# Patient Record
Sex: Female | Born: 1995 | Race: Asian | Hispanic: No | Marital: Single | Smoking: Never smoker
Health system: Southern US, Community
[De-identification: ages and names within clinical notes are randomized; demographics above are authoritative.]

## PROBLEM LIST (undated history)

## (undated) DIAGNOSIS — K644 Residual hemorrhoidal skin tags: Secondary | ICD-10-CM

## (undated) DIAGNOSIS — Z789 Other specified health status: Secondary | ICD-10-CM

## (undated) DIAGNOSIS — O4290 Premature rupture of membranes, unspecified as to length of time between rupture and onset of labor, unspecified weeks of gestation: Secondary | ICD-10-CM

## (undated) HISTORY — PX: NO PAST SURGERIES: SHX2092

## (undated) HISTORY — DX: Other specified health status: Z78.9

## (undated) HISTORY — DX: Residual hemorrhoidal skin tags: K64.4

---
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
## 2018-02-27 NOTE — L&D Delivery Note (Signed)
Delivery Note Pt pushed very well for 39minutes for delivery.  At 5:29 AM a viable and healthy female was delivered via Vaginal, Spontaneous (Presentation: OA; LOT  ).  APGAR: 9,9 ; weight P .   Placenta status: delivered, intact .  Cord: 3V with the following complications: none.    Anesthesia:  epidural Episiotomy: None Lacerations: Labial Suture Repair: 3.0 vicryl rapide Est. Blood Loss (mL): 14  Mom to postpartum.  Baby to Couplet care / Skin to Skin.  Kari Schmidt 10/07/2018, 5:52 AM  Br/O+/RI/Tdap in PNC/comtra?  D/w pt circumcision for female infant inc r/b/a wish to proceed in office

## 2018-03-27 LAB — OB RESULTS CONSOLE GC/CHLAMYDIA
Chlamydia: NEGATIVE
Gonorrhea: NEGATIVE

## 2018-03-27 LAB — OB RESULTS CONSOLE HIV ANTIBODY (ROUTINE TESTING): HIV: NONREACTIVE

## 2018-03-27 LAB — OB RESULTS CONSOLE ANTIBODY SCREEN: Antibody Screen: NEGATIVE

## 2018-03-27 LAB — OB RESULTS CONSOLE HEPATITIS B SURFACE ANTIGEN: Hepatitis B Surface Ag: NEGATIVE

## 2018-03-27 LAB — OB RESULTS CONSOLE RUBELLA ANTIBODY, IGM: Rubella: IMMUNE

## 2018-03-27 LAB — OB RESULTS CONSOLE ABO/RH: RH Type: POSITIVE

## 2018-03-27 LAB — OB RESULTS CONSOLE RPR: RPR: NONREACTIVE

## 2018-07-30 LAB — OB RESULTS CONSOLE RPR: RPR: NONREACTIVE

## 2018-07-30 LAB — OB RESULTS CONSOLE HIV ANTIBODY (ROUTINE TESTING): HIV: NONREACTIVE

## 2018-09-27 LAB — OB RESULTS CONSOLE GBS: GBS: NEGATIVE

## 2018-10-06 ENCOUNTER — Inpatient Hospital Stay (HOSPITAL_COMMUNITY): Payer: BLUE CROSS/BLUE SHIELD | Admitting: Anesthesiology

## 2018-10-06 ENCOUNTER — Inpatient Hospital Stay (HOSPITAL_COMMUNITY)
Admission: AD | Admit: 2018-10-06 | Discharge: 2018-10-09 | DRG: 807 | Disposition: A | Discharge status: Home / Self Care | Payer: BLUE CROSS/BLUE SHIELD | Attending: Obstetrics and Gynecology | Admitting: Obstetrics and Gynecology

## 2018-10-06 ENCOUNTER — Other Ambulatory Visit: Payer: Self-pay

## 2018-10-06 ENCOUNTER — Encounter (HOSPITAL_COMMUNITY): Payer: Self-pay | Admitting: *Deleted

## 2018-10-06 DIAGNOSIS — Z20828 Contact with and (suspected) exposure to other viral communicable diseases: Secondary | ICD-10-CM | POA: Diagnosis present

## 2018-10-06 DIAGNOSIS — Z3A38 38 weeks gestation of pregnancy: Secondary | ICD-10-CM

## 2018-10-06 DIAGNOSIS — O4292 Full-term premature rupture of membranes, unspecified as to length of time between rupture and onset of labor: Principal | ICD-10-CM | POA: Diagnosis present

## 2018-10-06 DIAGNOSIS — O4290 Premature rupture of membranes, unspecified as to length of time between rupture and onset of labor, unspecified weeks of gestation: Secondary | ICD-10-CM

## 2018-10-06 HISTORY — DX: Premature rupture of membranes, unspecified as to length of time between rupture and onset of labor, unspecified weeks of gestation: O42.90

## 2018-10-06 LAB — CBC
HCT: 39.9 % (ref 36.0–46.0)
Hemoglobin: 13.2 g/dL (ref 12.0–15.0)
MCH: 28.6 pg (ref 26.0–34.0)
MCHC: 33.1 g/dL (ref 30.0–36.0)
MCV: 86.4 fL (ref 80.0–100.0)
Platelets: 217 10*3/uL (ref 150–400)
RBC: 4.62 MIL/uL (ref 3.87–5.11)
RDW: 13.9 % (ref 11.5–15.5)
WBC: 9.8 10*3/uL (ref 4.0–10.5)
nRBC: 0 % (ref 0.0–0.2)

## 2018-10-06 LAB — SARS CORONAVIRUS 2 BY RT PCR (HOSPITAL ORDER, PERFORMED IN ~~LOC~~ HOSPITAL LAB): SARS Coronavirus 2: NEGATIVE

## 2018-10-06 LAB — TYPE AND SCREEN
ABO/RH(D): O POS
Antibody Screen: NEGATIVE

## 2018-10-06 MED ORDER — DIPHENHYDRAMINE HCL 50 MG/ML IJ SOLN: 12.5000 mg | INTRAMUSCULAR | Status: DC | PRN

## 2018-10-06 MED ORDER — LACTATED RINGERS IV SOLN
500.0000 mL | Freq: Once | INTRAVENOUS | Status: AC
  Administered 2018-10-06: 23:00:00 500 mL via INTRAVENOUS

## 2018-10-06 MED ORDER — PHENYLEPHRINE 40 MCG/ML (10ML) SYRINGE FOR IV PUSH (FOR BLOOD PRESSURE SUPPORT): 80.0000 ug | PREFILLED_SYRINGE | INTRAVENOUS | Status: DC | PRN

## 2018-10-06 MED ORDER — OXYCODONE-ACETAMINOPHEN 5-325 MG PO TABS: 2.0000 | ORAL_TABLET | ORAL | Status: DC | PRN

## 2018-10-06 MED ORDER — LACTATED RINGERS IV SOLN
500.0000 mL | INTRAVENOUS | Status: DC | PRN
  Administered 2018-10-07: 500 mL via INTRAVENOUS

## 2018-10-06 MED ORDER — OXYTOCIN 40 UNITS IN NORMAL SALINE INFUSION - SIMPLE MED
1.0000 m[IU]/min | INTRAVENOUS | Status: DC
  Filled 2018-10-06: qty 1000

## 2018-10-06 MED ORDER — ACETAMINOPHEN 325 MG PO TABS
650.0000 mg | ORAL_TABLET | ORAL | Status: DC | PRN
  Administered 2018-10-07: 650 mg via ORAL
  Filled 2018-10-06: qty 2

## 2018-10-06 MED ORDER — ONDANSETRON HCL 4 MG/2ML IJ SOLN: 4.0000 mg | Freq: Four times a day (QID) | INTRAMUSCULAR | Status: DC | PRN

## 2018-10-06 MED ORDER — OXYTOCIN BOLUS FROM INFUSION
500.0000 mL | Freq: Once | INTRAVENOUS | Status: AC
  Administered 2018-10-07: 06:00:00 500 mL via INTRAVENOUS

## 2018-10-06 MED ORDER — TERBUTALINE SULFATE 1 MG/ML IJ SOLN: 0.2500 mg | Freq: Once | INTRAMUSCULAR | Status: DC | PRN

## 2018-10-06 MED ORDER — LIDOCAINE HCL (PF) 1 % IJ SOLN: 30.0000 mL | INTRAMUSCULAR | Status: DC | PRN

## 2018-10-06 MED ORDER — SODIUM CHLORIDE (PF) 0.9 % IJ SOLN
INTRAMUSCULAR | Status: DC | PRN
  Administered 2018-10-06: 12 mL/h via EPIDURAL

## 2018-10-06 MED ORDER — FENTANYL-BUPIVACAINE-NACL 0.5-0.125-0.9 MG/250ML-% EP SOLN
12.0000 mL/h | EPIDURAL | Status: DC | PRN
  Filled 2018-10-06: qty 250

## 2018-10-06 MED ORDER — OXYCODONE-ACETAMINOPHEN 5-325 MG PO TABS: 1.0000 | ORAL_TABLET | ORAL | Status: DC | PRN

## 2018-10-06 MED ORDER — OXYTOCIN 40 UNITS IN NORMAL SALINE INFUSION - SIMPLE MED
2.5000 [IU]/h | INTRAVENOUS | Status: DC
  Administered 2018-10-07: 2.5 [IU]/h via INTRAVENOUS

## 2018-10-06 MED ORDER — LACTATED RINGERS IV SOLN
INTRAVENOUS | Status: DC
  Administered 2018-10-06 (×2): via INTRAVENOUS

## 2018-10-06 MED ORDER — EPHEDRINE 5 MG/ML INJ: 10.0000 mg | INTRAVENOUS | Status: DC | PRN

## 2018-10-06 MED ORDER — SOD CITRATE-CITRIC ACID 500-334 MG/5ML PO SOLN: 30.0000 mL | ORAL | Status: DC | PRN

## 2018-10-06 MED ORDER — OXYTOCIN 40 UNITS IN NORMAL SALINE INFUSION - SIMPLE MED: 1.0000 m[IU]/min | INTRAVENOUS | Status: DC

## 2018-10-06 MED ORDER — FENTANYL-BUPIVACAINE-NACL 0.5-0.125-0.9 MG/250ML-% EP SOLN: 12.0000 mL/h | EPIDURAL | Status: DC | PRN

## 2018-10-06 MED ORDER — LIDOCAINE HCL (PF) 1 % IJ SOLN
INTRAMUSCULAR | Status: DC | PRN
  Administered 2018-10-06 (×2): 5 mL via EPIDURAL

## 2018-10-06 MED ORDER — BUTORPHANOL TARTRATE 1 MG/ML IJ SOLN
1.0000 mg | INTRAMUSCULAR | Status: DC | PRN
  Filled 2018-10-06: qty 1

## 2018-10-06 NOTE — MAU Note (Signed)
Kari Schmidt is a 23 y.o. at [redacted]w[redacted]d here in MAU reporting:  +LOF Clear +contractions Onset of complaint: 3pm today Pain score: 6/10 Desires natural birth at this time Vitals:   10/06/18 1733  BP: 119/87  Pulse: 76  Resp: 16  Temp: 98.2 F (36.8 C)  SpO2: 99%     FHT: 161 Lab orders placed from triage: mau labor triage

## 2018-10-06 NOTE — Anesthesia Procedure Notes (Signed)
Epidural Patient location during procedure: OB  Staffing Anesthesiologist: Kerline Trahan, MD Performed: anesthesiologist   Preanesthetic Checklist Completed: patient identified, site marked, surgical consent, pre-op evaluation, timeout performed, IV checked, risks and benefits discussed and monitors and equipment checked  Epidural Patient position: sitting Prep: DuraPrep Patient monitoring: heart rate, continuous pulse ox and blood pressure Approach: right paramedian Location: L3-L4 Injection technique: LOR saline  Needle:  Needle type: Tuohy  Needle gauge: 17 G Needle length: 9 cm and 9 Needle insertion depth: 5 cm Catheter type: closed end flexible Catheter size: 20 Guage Catheter at skin depth: 9 cm Test dose: negative  Assessment Events: blood not aspirated, injection not painful, no injection resistance, negative IV test and no paresthesia  Additional Notes Patient identified. Risks/Benefits/Options discussed with patient including but not limited to bleeding, infection, nerve damage, paralysis, failed block, incomplete pain control, headache, blood pressure changes, nausea, vomiting, reactions to medication both or allergic, itching and postpartum back pain. Confirmed with bedside nurse the patient's most recent platelet count. Confirmed with patient that they are not currently taking any anticoagulation, have any bleeding history or any family history of bleeding disorders. Patient expressed understanding and wished to proceed. All questions were answered. Sterile technique was used throughout the entire procedure. Please see nursing notes for vital signs. Test dose was given through epidural needle and negative prior to continuing to dose epidural or start infusion. Warning signs of high block given to the patient including shortness of breath, tingling/numbness in hands, complete motor block, or any concerning symptoms with instructions to call for help. Patient was given  instructions on fall risk and not to get out of bed. All questions and concerns addressed with instructions to call with any issues.     

## 2018-10-06 NOTE — Progress Notes (Signed)
Patient ID: Kari Schmidt, female   DOB: 1995-10-11, 23 y.o.   MRN: [id number]  38+ with SROM, sugmentation of labor  Uncomfortable with contractions, desires epidural now  AFVSS  gen NAD  FHTs 150-155, mod var, + accels, category 1,  toco q 26min  7/90/0-+1  Continue current mgmt, expect SVD

## 2018-10-06 NOTE — MAU Note (Signed)
Patient denies any symptoms. covid admission test collected.

## 2018-10-06 NOTE — H&P (Addendum)
Kari Schmidt is a 23 y.o. female G1P0 at 15+ with SROM, confirmed w amniosure in MAU.  Relatively uncomplicated PNC.  Dated by LMP.  Received Tdap 07/30/18    OB History    Gravida  1   Para      Term      Preterm      AB      Living        SAB      TAB      Ectopic      Multiple      Live Births            G1 present  No STD No abn pap  Past Medical History:  Diagnosis Date  . PROM (premature rupture of membranes) 10/06/2018   History reviewed. No pertinent surgical history. Family History: none Social History:  has no history on file for tobacco, alcohol, and drug.   Meds PNV,  All NKDA     Maternal Diabetes: No Genetic Screening: Normal Maternal Ultrasounds/Referrals: Normal Fetal Ultrasounds or other Referrals:  None Maternal Substance Abuse:  No Significant Maternal Medications:  None Significant Maternal Lab Results:  Group B Strep negative Other Comments:  None  Review of Systems  Constitutional: Negative.   HENT: Negative.   Eyes: Negative.   Respiratory: Negative.   Cardiovascular: Negative.   Gastrointestinal: Negative.   Genitourinary: Negative.   Musculoskeletal: Negative.   Skin: Negative.   Neurological: Negative.   Psychiatric/Behavioral: Negative.    Maternal Medical History:  Reason for admission: Rupture of membranes.   Contractions: Frequency: irregular.    Fetal activity: Perceived fetal activity is normal.    Prenatal Complications - Diabetes: none.    Dilation: 4 Effacement (%): 80 Station: -1 Exam by:: christina robinson rn Blood pressure 119/87, pulse 76, temperature 98.2 F (36.8 C), temperature source Oral, resp. rate 16, weight 59 kg, SpO2 99 %. Maternal Exam:  Abdomen: Patient reports no abdominal tenderness. Fundal height is appropriate for gestation.   Estimated fetal weight is 6-7#.   Fetal presentation: vertex  Introitus: Normal vulva. Normal vagina.    Physical Exam  Constitutional: She is oriented  to person, place, and time. She appears well-developed and well-nourished.  HENT:  Head: Normocephalic and atraumatic.  Cardiovascular: Normal rate and regular rhythm.  Respiratory: Effort normal and breath sounds normal. No respiratory distress. She has no wheezes.  GI: Soft. Bowel sounds are normal. She exhibits no distension. There is no abdominal tenderness.  Genitourinary:    Vulva normal.   Musculoskeletal: Normal range of motion.  Neurological: She is alert and oriented to person, place, and time.  Skin: Skin is warm and dry.  Psychiatric: She has a normal mood and affect. Her behavior is normal.    Prenatal labs: ABO, Rh:  O+ Antibody:  neg Rubella:  immune RPR:   NR HBsAg:   neg HIV:   neg GBS:   neg  Hgb 13.7/Plt 256/Ur Cx neg/GC neg/Chl neg/Varicella NI/Hgb electro WNL/1st tri screen WNL/glucola 98/essential panel CF neg, essential panel neg [place] nl anat, ant plac  Assessment/Plan: 23yo G1P0 at 38+ w SROM for IOL Expect SVD GBBS neg Pitocin prn Epidural and IV pain meds prn   Cherlyn Syring Bovard-Stuckert 10/06/2018, 6:19 PM

## 2018-10-06 NOTE — Anesthesia Preprocedure Evaluation (Signed)

## 2018-10-07 ENCOUNTER — Encounter (HOSPITAL_COMMUNITY): Payer: Self-pay | Admitting: Obstetrics and Gynecology

## 2018-10-07 LAB — ABO/RH: ABO/RH(D): O POS

## 2018-10-07 LAB — RPR: RPR Ser Ql: NONREACTIVE

## 2018-10-07 MED ORDER — PRENATAL MULTIVITAMIN CH
1.0000 | ORAL_TABLET | Freq: Every day | ORAL | Status: DC
  Administered 2018-10-07 – 2018-10-08 (×2): 1 via ORAL
  Filled 2018-10-07 (×3): qty 1

## 2018-10-07 MED ORDER — SENNOSIDES-DOCUSATE SODIUM 8.6-50 MG PO TABS
2.0000 | ORAL_TABLET | ORAL | Status: DC
  Administered 2018-10-07 – 2018-10-08 (×2): 2 via ORAL
  Filled 2018-10-07 (×2): qty 2

## 2018-10-07 MED ORDER — ACETAMINOPHEN 325 MG PO TABS: 650.0000 mg | ORAL_TABLET | ORAL | Status: DC | PRN

## 2018-10-07 MED ORDER — COCONUT OIL OIL
1.0000 "application " | TOPICAL_OIL | Status: DC | PRN
  Administered 2018-10-09: 1 via TOPICAL

## 2018-10-07 MED ORDER — ONDANSETRON HCL 4 MG PO TABS: 4.0000 mg | ORAL_TABLET | ORAL | Status: DC | PRN

## 2018-10-07 MED ORDER — ONDANSETRON HCL 4 MG/2ML IJ SOLN: 4.0000 mg | INTRAMUSCULAR | Status: DC | PRN

## 2018-10-07 MED ORDER — IBUPROFEN 600 MG PO TABS
600.0000 mg | ORAL_TABLET | Freq: Four times a day (QID) | ORAL | Status: DC
  Administered 2018-10-07 – 2018-10-09 (×7): 600 mg via ORAL
  Filled 2018-10-07 (×8): qty 1

## 2018-10-07 MED ORDER — DIBUCAINE (PERIANAL) 1 % EX OINT: 1.0000 "application " | TOPICAL_OINTMENT | CUTANEOUS | Status: DC | PRN

## 2018-10-07 MED ORDER — SIMETHICONE 80 MG PO CHEW: 80.0000 mg | CHEWABLE_TABLET | ORAL | Status: DC | PRN

## 2018-10-07 MED ORDER — WITCH HAZEL-GLYCERIN EX PADS: 1.0000 "application " | MEDICATED_PAD | CUTANEOUS | Status: DC | PRN

## 2018-10-07 MED ORDER — BENZOCAINE-MENTHOL 20-0.5 % EX AERO
1.0000 "application " | INHALATION_SPRAY | CUTANEOUS | Status: DC | PRN
  Administered 2018-10-07: 1 via TOPICAL
  Filled 2018-10-07: qty 56

## 2018-10-07 MED ORDER — DIPHENHYDRAMINE HCL 25 MG PO CAPS: 25.0000 mg | ORAL_CAPSULE | Freq: Four times a day (QID) | ORAL | Status: DC | PRN

## 2018-10-07 MED ORDER — TETANUS-DIPHTH-ACELL PERTUSSIS 5-2.5-18.5 LF-MCG/0.5 IM SUSP: 0.5000 mL | Freq: Once | INTRAMUSCULAR | Status: DC

## 2018-10-07 MED ORDER — LACTATED RINGERS IV SOLN: INTRAVENOUS | Status: DC

## 2018-10-07 MED ORDER — ZOLPIDEM TARTRATE 5 MG PO TABS: 5.0000 mg | ORAL_TABLET | Freq: Every evening | ORAL | Status: DC | PRN

## 2018-10-07 NOTE — Anesthesia Postprocedure Evaluation (Signed)
Anesthesia Post Note  Patient: Jamesia Linnen  Procedure(s) Performed: AN AD Wilson     Patient location during evaluation: Mother Baby Anesthesia Type: Epidural Level of consciousness: awake and alert Pain management: pain level controlled Vital Signs Assessment: post-procedure vital signs reviewed and stable Respiratory status: spontaneous breathing, nonlabored ventilation and respiratory function stable Cardiovascular status: stable Postop Assessment: no headache, no backache and epidural receding Anesthetic complications: no    Last Vitals:  Vitals:   10/07/18 0850 10/07/18 1250  BP: 107/81 112/64  Pulse: 95 78  Resp: 18 18  Temp: 37 C 36.4 C  SpO2:      Last Pain:  Vitals:   10/07/18 1250  TempSrc: Oral  PainSc: 0-No pain   Pain Goal: Patients Stated Pain Goal: 0 (10/06/18 1730)                 Toriann Spadoni

## 2018-10-07 NOTE — Progress Notes (Signed)
Patient ID: Kari Schmidt, female   DOB: 1995-05-02, 23 y.o.   MRN: [id number] PPD#0 Pt doing well hours after delivery. She is sitting up, breastfeeding, slight fatigue. She denies pain, HA, blurry vision, SOB or CP. Lochia mild/moderate.  VSS GEN - NAD ABD - FF EXT - no homans  A/P: PPD#0 s/p svd first baby - stable         Routine pp care

## 2018-10-08 LAB — CBC
HCT: 33.3 % — ABNORMAL LOW (ref 36.0–46.0)
Hemoglobin: 10.9 g/dL — ABNORMAL LOW (ref 12.0–15.0)
MCH: 28.3 pg (ref 26.0–34.0)
MCHC: 32.7 g/dL (ref 30.0–36.0)
MCV: 86.5 fL (ref 80.0–100.0)
Platelets: 180 10*3/uL (ref 150–400)
RBC: 3.85 MIL/uL — ABNORMAL LOW (ref 3.87–5.11)
RDW: 13.9 % (ref 11.5–15.5)
WBC: 14.5 10*3/uL — ABNORMAL HIGH (ref 4.0–10.5)
nRBC: 0 % (ref 0.0–0.2)

## 2018-10-08 LAB — BIRTH TISSUE RECOVERY COLLECTION (PLACENTA DONATION)

## 2018-10-08 NOTE — Progress Notes (Signed)
CSW received consult due to score 11 on Edinburgh Depression Screen.    When CSW arrived, [name] was resting on the couch and infant was asleep in the bassinet.  Throughout the assessment [name] was attentive to infant and responded appropriately to infant's cues. CSW explained CSW's role and invited [name] to ask questions. [name] was polite, easy to engage, and was receptive to meeting with CSW.   [name] shared that [name] and FOB are not in a relationship but has agreed to be great co-parents.  [name] also reported that she has a great support team that consists of [name]'s and FOB's immediate and extended family.  Per [name], FOB is currently deployed however has been supportive from a distance during [name]'s pregnancy.   CSW reviewed [name]'s Edinburgh responses and [name] stated, "My responses were based on my delivery. I was nervous, scared, and worried." CSW validated and normalized [name]'s thoughts and feelings.  [name] denied having any of those thoughts currently and shared that feels excited about being a new mom and happy.    CSW provided education regarding Baby Blues vs PMADs and provided [name] with resources for mental health follow up.  CSW encouraged [name] to evaluate her mental health throughout the postpartum period with the use of the New Mom Checklist developed by Postpartum Progress as well as the Edinburgh Postnatal Depression Scale and notify a medical professional if symptoms arise. CSW did not present with any acute symptoms and denied SI and HI.  Per [name], [name] has all essential items to care for infant. CSW offered [name] resources for outpatient counseling and [name] declined.  There are no barriers to discharge.    Jazelle Achey Boyd-Gilyard, MSW, LCSW Clinical Social Work [phone] 

## 2018-10-08 NOTE — Lactation Note (Addendum)
This note was copied from a baby's chart. Lactation Consultation Note  Patient Name: Kari Schmidt DHRCB'U Date: 10/08/2018 Reason for consult: 1st time breastfeeding;Initial assessment;Early term 37-38.6wks P1, 25 hour female infant, ETI Mom unsure of how many times she has breastfeed, LC encouraged her to document feedings and how long infant is latched to breast. Infant had 3 wet  Per mom, she receives [facility] in [place]. Mom latched infant on left breast using cross cradle hold, infant was off and on at first but eventually sustained latch and breastfeed for 21 minutes. Infant was given 2 ml of colostrum that mom hand expressed and after infant latched at breast infant given 3 ml of Gerber Gentle formula due infant refusing take any more supplement at this time.  Mom was given hand pump do to not having a hand pump at home.  LC discussed importance of mom expressing a small amount of colostrum prior to latching infant, wait until infant's mouth is wide and tongue down with nose and tongue touching breast. Infant has tight frenulum and cannot extend tongue past gum line, LC mention to mom to as Pediatrician to examine infant's oral cavity in mouth.  Mom has pseudo inverted nipples that are very compressible mom taught back hand expression and infant was given 2 ml of colostrum by spoon.  Mm knows to breastfeed according hunger cues, 8 to 12 times within 24 hours and on demand sheet given. Mom will do as much STS as possible. Reviewed Baby & Me book's Breastfeeding Basics.  Mom made aware of O/P services, breastfeeding support groups, community resources, and our phone # for post-discharge questions.    Maternal Data Formula Feeding for Exclusion: Yes Reason for exclusion: Mother's choice to formula and breast feed on admission Has patient been taught Hand Expression?: Yes(Infant given 2 ml of colostrum by spoon.) Does the patient have breastfeeding experience prior to this delivery?:  No  Feeding Feeding Type: Breast Fed  LATCH Score Latch: Grasps breast easily, tongue down, lips flanged, rhythmical sucking.  Audible Swallowing: A few with stimulation  Type of Nipple: Inverted  Comfort (Breast/Nipple): Soft / non-tender  Hold (Positioning): Assistance needed to correctly position infant at breast and maintain latch.  LATCH Score: 6  Interventions Interventions: Breast feeding basics reviewed;Breast compression;Assisted with latch;Adjust position;Skin to skin;Support pillows;Hand pump;Breast massage;Position options;Hand express;Expressed milk  Lactation Tools Discussed/Used WIC Program: Yes Pump Review: Setup, frequency, and cleaning;Milk Storage Initiated by:: Vicente Serene, IBCLC Date initiated:: 10/08/18   Consult Status Consult Status: Follow-up Date: 10/08/18 Follow-up type: In-patient    Vicente Serene 10/08/2018, 6:36 AM

## 2018-10-08 NOTE — Progress Notes (Signed)
Post Partum Day 1 Subjective: no complaints, up ad lib and tolerating PO  Objective: Blood pressure 108/76, pulse 90, temperature 98.1 F (36.7 C), temperature source Oral, resp. rate 18, height 5\' 2"  (1.575 m), weight 59 kg, SpO2 100 %, unknown if currently breastfeeding.  Physical Exam:  General: alert and cooperative Lochia: appropriate Uterine Fundus: firm   Recent Labs    10/06/18 1756 10/08/18 0527  HGB 13.2 10.9*  HCT 39.9 33.3*    Assessment/Plan: Plan for discharge tomorrow  Baby needs to stay 48 hours per RN   LOS: 2 days   Logan Bores 10/08/2018, 10:28 AM

## 2018-10-09 MED ORDER — IBUPROFEN 600 MG PO TABS: 600.0000 mg | ORAL_TABLET | Freq: Four times a day (QID) | ORAL | 0 refills | Status: DC

## 2018-10-09 NOTE — Lactation Note (Addendum)
This note was copied from a baby's chart. Lactation Consultation Note  Patient Name: Kari Schmidt OEHOZ'Y Date: 10/09/2018  P1, 45 hour female infant, weight loss -4%. Per mom, infant had 3 voids and 5 stools yesterday. Mom's feeding choice is breast and formula feeding.  Mom feels "Kari Schmidt" is breastfeeding well.  Per mom, infant breastfeed for 10 minutes at 4 am.  LC did not observe latch at this time infant is asleep in mom's arms.  She mostly breastfeed yesterday and she did offer him formula 3 times after latching him at breast. Mom has no breastfeeding concerns at this time. Mom knows to ask Nurse or [place] if she has any questions, concerns or need assistance with latching infant to breast.  Mom has list out outreach breastfeeding resources and breastfeeding support groups in the Community. LC discussed engorgement treatment and prevention.   Maternal Data    Feeding    LATCH Score                   Interventions    Lactation Tools Discussed/Used     Consult Status      Vicente Serene 10/09/2018, 5:15 AM

## 2018-10-09 NOTE — Discharge Summary (Signed)
    OB Discharge Summary     Patient Name: Kari Schmidt DOB: 1996/01/08 MRN: [id number]  Date of admission: 10/06/2018 Delivering MD: Janyth Contes   Date of discharge: 10/09/2018  Admitting diagnosis: CTX 4 min Intrauterine pregnancy: [redacted]w[redacted]d     Secondary diagnosis:  Principal Problem:   SVD (spontaneous vaginal delivery) Active Problems:   PROM (premature rupture of membranes)      Discharge diagnosis: Term Pregnancy [facility] course:  Onset of Labor With Vaginal Delivery     23 y.o. yo G1P1001 at [redacted]w[redacted]d was admitted in Latent Labor with SROM on 10/06/2018. Patient had an uncomplicated labor course as follows:  Membrane Rupture Time/Date: 3:00 PM ,10/06/2018   Intrapartum Procedures: Episiotomy: None [1]                                         Lacerations:  Labial [10]  Patient had a delivery of a Viable infant. 10/07/2018  Information for the patient's newborn:  Antwanette, Wesche [[id number]]  Delivery Method: Vaginal, Spontaneous(Filed from Delivery Summary)     Pateint had an uncomplicated postpartum course.  She is ambulating, tolerating a regular diet, passing flatus, and urinating well. Patient is discharged home in stable condition on 10/09/18.   Physical exam  Vitals:   10/08/18 0650 10/08/18 1500 10/08/18 2100 10/09/18 0516  BP: 108/76 99/64 109/79 107/70  Pulse: 90 80 81 80  Resp: 18 16 18 16   Temp: 98.1 F (36.7 C) 98.2 F (36.8 C) 98.4 F (36.9 C) 98.1 F (36.7 C)  TempSrc: Oral Oral Oral Oral  SpO2:  98% 99% 99%  Weight:      Height:       General: alert Lochia: appropriate Uterine Fundus: firm  Labs: Lab Results  Component Value Date   WBC 14.5 (H) 10/08/2018   HGB 10.9 (L) 10/08/2018   HCT 33.3 (L) 10/08/2018   MCV 86.5 10/08/2018   PLT 180 10/08/2018   No flowsheet data found.  Discharge instruction: per After Visit Summary and "Baby and Me Booklet".  After visit meds:  Allergies as of 10/09/2018   No  Known Allergies     Medication List    TAKE these medications   ibuprofen 600 MG tablet Commonly known as: ADVIL Take 1 tablet (600 mg total) by mouth every 6 (six) hours.       Diet: routine diet  Activity: Advance as tolerated. Pelvic rest for 6 weeks.   Outpatient follow up:6 weeks  Newborn Data: Live born female  Birth Weight: 7 lb 0.5 oz (3189 g) APGAR: 9, 9  Newborn Delivery   Birth date/time: 10/07/2018 05:29:00 Delivery type: Vaginal, Spontaneous      Baby Feeding: Bottle Disposition:home with mother   10/09/2018 Clarene Duke, MD

## 2018-10-09 NOTE — Progress Notes (Signed)
PPD #2 Doing well Afeb, VSS Fundus firm D/c home 

## 2018-10-09 NOTE — Discharge Instructions (Signed)
As per discharge pamphlet °

## 2018-10-19 ENCOUNTER — Inpatient Hospital Stay (HOSPITAL_COMMUNITY)
Admission: AD | Admit: 2018-10-19 | Payer: BLUE CROSS/BLUE SHIELD | Source: Home / Self Care | Admitting: Obstetrics and Gynecology

## 2020-02-28 NOTE — L&D Delivery Note (Signed)
Delivery Note Kari Schmidt is a 25 y.o. G2P1001 at [redacted]w[redacted]d admitted for IOL for IUGR.   GBS Status: Negative/-- (05/16 1615) Maximum Maternal Temperature: 98.3  Labor course: Initial SVE: 3/50/-2. Augmentation with: Pitocin. She then progressed to complete.  ROM: 0h 56m with clear fluid  Birth: At 1642 a viable female was delivered via spontaneous vaginal delivery (Presentation: cephalic; LOA). Nuchal cord present x1, baby somersaulted and delivered through. Shoulders and body delivered in usual fashion. Infant placed directly on mom's abdomen for bonding/skin-to-skin, baby dried and stimulated. Cord clamped x 2 after 1 minute and cut by patient. Cord blood collected. The placenta separated spontaneously and delivered via gentle cord traction. Pitocin infused rapidly IV per protocol.  Fundus firm with massage.  Placenta inspected and appears to be intact with a 3 VC.  Placenta/Cord with the following complications: none.  Cord pH: n/a Sponge and instrument count were correct x2.  Intrapartum complications:  None Anesthesia:  epidural Episiotomy: none Lacerations: perineal abrasion, hemostatic and not repaired Suture Repair: n/a EBL (mL):   Infant: APGAR (1 MIN): 8   APGAR (5 MINS): 9   Infant weight: pending  Mom to postpartum.  Baby to Couplet care / Skin to Skin. Placenta to Pathology for IUGR   Plans to Breast and bottlefeed Contraception: Depo-Provera injections Circumcision: unsure  Note sent to [facility]: MCW for pp visit.   Brand Males, MSN, CNM 07/29/2020 6:04 PM

## 2020-03-16 LAB — OB RESULTS CONSOLE GC/CHLAMYDIA
Chlamydia: NEGATIVE
Gonorrhea: NEGATIVE

## 2020-03-16 LAB — OB RESULTS CONSOLE PLATELET COUNT
Platelets: 201
Platelets: 201

## 2020-03-16 LAB — OB RESULTS CONSOLE RUBELLA ANTIBODY, IGM: Rubella: IMMUNE

## 2020-03-16 LAB — OB RESULTS CONSOLE VARICELLA ZOSTER ANTIBODY, IGG: Varicella: NON-IMMUNE/NOT IMMUNE

## 2020-03-16 LAB — OB RESULTS CONSOLE HGB/HCT, BLOOD
HCT: 33 (ref 29–41)
Hemoglobin: 11.2

## 2020-03-16 LAB — OB RESULTS CONSOLE ABO/RH: RH Type: POSITIVE

## 2020-03-16 LAB — CYTOLOGY - PAP
Glucose 1 Hour: 91
Urine Culture, OB: NEGATIVE

## 2020-03-16 LAB — OB RESULTS CONSOLE HIV ANTIBODY (ROUTINE TESTING): HIV: NONREACTIVE

## 2020-04-12 ENCOUNTER — Other Ambulatory Visit: Payer: Self-pay | Admitting: Family

## 2020-04-12 DIAGNOSIS — Z363 Encounter for antenatal screening for malformations: Secondary | ICD-10-CM

## 2020-04-20 ENCOUNTER — Encounter: Payer: Self-pay | Admitting: *Deleted

## 2020-04-28 ENCOUNTER — Ambulatory Visit: Payer: Medicaid Other | Attending: Obstetrics and Gynecology

## 2020-04-28 ENCOUNTER — Other Ambulatory Visit: Payer: BLUE CROSS/BLUE SHIELD

## 2020-05-19 ENCOUNTER — Encounter: Payer: Medicaid Other | Admitting: Certified Nurse Midwife

## 2020-05-21 ENCOUNTER — Other Ambulatory Visit: Payer: Self-pay

## 2020-05-21 ENCOUNTER — Encounter: Payer: Self-pay | Admitting: *Deleted

## 2020-05-21 ENCOUNTER — Encounter: Payer: Self-pay | Admitting: Medical

## 2020-05-21 ENCOUNTER — Ambulatory Visit (INDEPENDENT_AMBULATORY_CARE_PROVIDER_SITE_OTHER): Payer: Medicaid Other | Admitting: Medical

## 2020-05-21 VITALS — BP 98/72 | HR 90 | Wt 115.3 lb

## 2020-05-21 DIAGNOSIS — Z23 Encounter for immunization: Secondary | ICD-10-CM | POA: Diagnosis not present

## 2020-05-21 DIAGNOSIS — Z3480 Encounter for supervision of other normal pregnancy, unspecified trimester: Secondary | ICD-10-CM | POA: Diagnosis not present

## 2020-05-21 DIAGNOSIS — Z3A29 29 weeks gestation of pregnancy: Secondary | ICD-10-CM

## 2020-05-21 DIAGNOSIS — O0933 Supervision of pregnancy with insufficient antenatal care, third trimester: Secondary | ICD-10-CM

## 2020-05-21 MED ORDER — BLOOD PRESSURE KIT DEVI: 1.0000 | Freq: Once | 0 refills | Status: AC

## 2020-05-21 NOTE — Progress Notes (Signed)
PRENATAL VISIT NOTE  Subjective:  Kari Schmidt is a 25 y.o. G2P1001 at 15w2dbeing seen today for her first prenatal visit for this pregnancy.  She is currently monitored for the following issues for this low-risk pregnancy and has Supervision of other normal pregnancy, antepartum on their problem list.  Patient reports no complaints.  Contractions: Not present. Vag. Bleeding: None.  Movement: Present. Denies leaking of fluid.   She is planning to both breast and bottlefeed. Desires Depo for contraception.   The following portions of the patient's history were reviewed and updated as appropriate: allergies, current medications, past family history, past medical history, past social history, past surgical history and problem list.   Objective:   Vitals:   05/21/20 0939  BP: 98/72  Pulse: 90  Weight: 115 lb 4.8 oz (52.3 kg)    Fetal Status: Fetal Heart Rate (bpm): 146 Fundal Height: 27 cm Movement: Present     General:  Alert, oriented and cooperative. Patient is in no acute distress.  Skin: Skin is warm and dry. No rash noted.   Cardiovascular: Normal heart rate and rhythm noted  Respiratory: Normal respiratory effort, no problems with respiration noted. Clear to auscultation.   Abdomen: Soft, gravid, appropriate for gestational age. Normal bowel sounds. Non-tender. Pain/Pressure: Absent     [facility] Ext[facility]ities: Normal range of motion.  Edema: None  Mental Status: Normal mood and affect. Normal behavior. Normal judgment and thought content.   Assessment and Plan:  Pregnancy: G2P1001 at 225w2d. Supervision of other normal pregnancy, antepartum - Blood Pressure Monitoring (BLOOD PRESSURE KIT) DEVI; 1 each by Does not apply route once for 1 dose.  Dispense: 1 each; Refill: 0 - Genetic Screening - [employer] and [employer] - Tdap vaccine greater than or equal to 7yo IM - [facility] will [facility] - Records requested for [place]  [facility]. Late prenatal care affecting pregnancy, antepartum, third trimester  3. [redacted] weeks gestation of pregnancy  Preterm labor warning symptoms and general obstetric precautions including but not limited to vaginal bleeding, contractions, leaking of fluid and fetal movement were reviewed in detail with the patient. Please refer to After Visit Summary for other counseling recommendations.   Discussed the normal visit cadence for prenatal care Discussed the nature of our practice with multiple providers including residents and students   Return in about 2 weeks (around 06/04/2020) for LOB, In-Person.  Future Appointments  Date Time Provider [place] Meeting3/30/2022  8:20 AM [facility]4/07/2020  2:35 PM WaGabriel CarinaCNM [facility]St[facility]  JuKerry HoughPA-C

## 2020-05-21 NOTE — Patient Instructions (Signed)
Safe Medications in Pregnancy   Acne:  Benzoyl Peroxide  Salicylic Acid   Backache/Headache:  Tylenol: 2 regular strength every 4 hours OR        2 Extra strength every 6 hours   Colds/Coughs/Allergies:  Benadryl (alcohol free) 25 mg every 6 hours as needed  Breath right strips  Claritin  Cepacol throat lozenges  Chloraseptic throat spray  Cold-Eeze- up to three times per day  Cough drops, alcohol free  Flonase (by prescription only)  Guaifenesin  Mucinex  Robitussin DM (plain only, alcohol free)  Saline nasal spray/drops  Sudafed (pseudoephedrine) & Actifed * use only after [redacted] weeks gestation and if you do not have high blood pressure  Tylenol  Vicks Vaporub  Zinc lozenges  Zyrtec   Constipation:  Colace  Ducolax suppositories  Fleet enema  Glycerin suppositories  Metamucil  Milk of magnesia  Miralax  Senokot  Smooth move tea   Diarrhea:  Kaopectate  Imodium A-D   *NO pepto Bismol   Hemorrhoids:  Anusol  Anusol HC  Preparation H  Tucks   Indigestion:  Tums  Maalox  Mylanta  Zantac  Pepcid   Insomnia:  Benadryl (alcohol free) 25mg  every 6 hours as needed  Tylenol PM  Unisom, no Gelcaps   Leg Cramps:  Tums  MagGel   Nausea/Vomiting:  Bonine  Dramamine  Emetrol  Ginger extract  Sea bands  Meclizine  Nausea medication to take during pregnancy:  Unisom (doxylamine succinate 25 mg tablets) Take one tablet daily at bedtime. If symptoms are not adequately controlled, the dose can be increased to a maximum recommended dose of two tablets daily (1/2 tablet in the morning, 1/2 tablet mid-afternoon and one at bedtime).  Vitamin B6 100mg  tablets. Take one tablet twice a day (up to 200 mg per day).   Skin Rashes:  Aveeno products  Benadryl cream or 25mg  every 6 hours as needed  Calamine Lotion  1% cortisone cream   Yeast infection:  Gyne-lotrimin 7  Monistat 7    **If taking multiple medications, please check labels to avoid  duplicating the same active ingredients  **take medication as directed on the label  ** Do not exceed 4000 mg of tylenol in 24 hours  **Do not take medications that contain aspirin or ibuprofen         AREA PEDIATRIC/FAMILY PRACTICE PHYSICIANS  [place]outheast [place] ( ) . [facility] [place]; [id number], MD; [facility], MD; Melodie Bouillon, MD; McDiarmid, MD; Lum Babe, MD; Sheffield Slider, MD; Leveda Anna, MD o [address] [place] [place], [address] [place] o [phone] o Mon-Fri 8:30-12:30, 1:30-5:00 o Providers come to see babies at [facility] o Accepting Medicaid . [facility] [place] o Limited providers who accept newborns: [phone], MD; [facility], MD; [place], MD o [address], [place], Paulino Rily [address] o [phone] o Mon-Fri 8:00-5:30 o Babies seen by providers at [facility] o Does NOT accept Medicaid o Please call early in hospitalization for appointment (limited availability)  . [facility] [phone], MD o [address], [place], Fatima Sanger [address] o [phone] o Mon, Tue, Thur, Fri 8:30-5:00, Wed 10:00-7:00 (closed 1-2pm) o Babies seen by [facility] providers o Accepting Medicaid . 09-27-1969 - Pediatrician 02-14-1999, MD o [address], [place], Fae Pippin [place] o [phone] o Mon-Fri 8:30-5:00, Sat 8:30-12:00 o Provider comes to see babies at [facility] o Accepting Medicaid o Must have been referred from current patients or contacted office  prior to delivery . [facility]) Leotis Pain, MD; Ave Filter, MD; Luna Fuse, MD; Kennedy Bucker, MD; Konrad Dolores, MD; Kathlene November, MD; Jenne Campus, MD; Lubertha South, MD; Wynetta Emery, MD; Duffy Rhody, MD; Gerre Couch, NP; Shirl Harris, NP o [address] [place], [place], [place] [address] o [phone] o Mon, Tue, Thur, Fri 8:30-5:30, Wed 9:30-5:30, Sat 8:30-12:30 o Babies seen by [facility]  providers o Accepting Medicaid o Only accepting infants of first-time parents or siblings of current patients [facility] discharge coordinator will make follow-up appointment . Cyril Mourning o [address] [place], [place]  [address] o [phone]   Fax - [phone] . [facility] o [address], [place], [place]  [address] o Phone - [phone]   Fax - [phone] . Lucio Edward o [address], [place], [place]  [address] o [phone]  [place] [phone]) . [facility] Jorge Mandril, MD; Alita Chyle, MD; Princella Ion, MD; MD; Earlene Plater, MD; Jamesetta Orleans, MD; Alvera Novel, MD; Clarene Duke, MD; Rana Snare, MD; Carmon Ginsberg, MD; Alinda Money, MD; Hosie Poisson, MD; Mayford Knife, MD o [address] [place], [place] [address] o [phone] o Mon-Fri 8:30-5:00 (extended evenings Mon-Thur as needed), Sat-Sun 10:00-1:00 o Providers come to see babies at [facility] o Accepting Medicaid for families of first-time babies and families with all children in the household age 47 and under. Must register with office prior to making appointment (M-F only). [facility] Odella Aquas, NP; Lynelle Doctor, MD; Susann Givens, MD; [place], [place] o [address], [place], [place] [address] o [phone] o Mon-Fri 8:00-5:00 o Babies seen by providers at [facility] o Does NOT accept Medicaid/Commercial Insurance Only . [facility] [place] ([facility])  Suzette Battiest, MD; Zachery Dauer, MD; Stefan Church, MD; Sabino Dick, MD; Quitman Livings, MD; Farris Has, MD; Gaynell Face, MD; Betha Loa, MD; Colon Flattery, MD; Clifton James, MD o [address] [place] [place], [place] [address] o [phone] o Mon-Fri 8:30-5:30, Sat (Oct.-Mar.) 9:00-1:00 o Babies seen by providers at [facility] o Accepting [facility] [phone]) . [facility] D[facility]ri, MD; Sheliah Hatch, MD o [address], [place], [place] [address] o [phone] o Mon-Fri 8:30-5:00, Sat  8:30-12:00 o Providers come to see babies at [facility] o Does NOT accept Medicaid . [facility] Cindy Hazy, [place]; Frannie, MD; [place], [place]; Wynelle Link, MD; Azucena Cecil, MD o [address], [place], [place] [address] o [phone] o Mon-Fri 8:00-5:00 o Babies seen by providers at [facility] o Does NOT accept Medicaid o Only accepting babies of parents who are patients o Please call early in hospitalization for appointment (limited availability) . [facility] Lamar Benes, MD; Abran Cantor, MD; Early Osmond, MD; Cherre Huger, NP; Hyacinth Meeker, MD; Dwan Bolt, MD; Jarold Motto, NP; Dario Guardian, MD; Talmage Nap, MD; Maisie Fus, MD; Pricilla Holm, MD; Tama High, MD o [address] [place], [place], [place] [address] o [phone] o Mon-Fri 8:00-5:00, Sat 9:00-12:00 o Providers come to see babies at [facility] o Does NOT accept [facility] [phone]) . [facility] o Limited providers accepting new patients: Drema Pry, NP; [place], [place] o [address], [place], [place] [address] o [phone] o Mon-Fri 8:00-5:00 o Babies seen by providers at [facility] o Does NOT accept Medicaid o Only accepting babies of parents who are patients o Please call early in hospitalization for appointment (limited availability) . [facility] Luan Pulling, MD; Nash Dimmer, MD o [address] [place] [address] [place], [place] [address]  o [phone] (press 1 to schedule appointment) o Mon-Fri 8:00-5:00 o Providers come to see babies at [facility] o Does NOT accept Medicaid . [facility] Cristino Martes, MD o [address], [place], [place] [address] o [phone] o Mon-Fri 8:30-5:00 (lunch 12:30-1:00), extended hours by appointment only Wed 5:00-6:30 o Babies seen by [facility] providers o Accepting Medicaid . [facility] Abbot, MD; [place], MD; Hassan Rowan, MD o [address] [place], [place], [place] [address] o [phone] o Mon-Fri  8:00-5:00 o Babies seen by [facility] providers o Does NOT accept Medicaid . Nature conservation officer at [place] [address] Elsworth Soho, MD; Durene Cal, MD; [place], DO o [address] [place], [place] [address] o [phone] o Mon-Fri 8:00-5:00 o Babies seen by [facility] providers o Does NOT accept Medicaid . [facility] o [place], [place]; [place], [place]; [place], NP; Avis Epley, MD; Vonna Kotyk, MD; Clance Boll, MD; Stevphen Rochester, NP; Arvilla Market, NP; Ann Maki, NP; Otis Dials, NP; Vaughan Basta, MD; [place], MD o [address] [place], [place] [address] o [phone] o Mon-Fri 8:30-5:00, Sat 10:00-1:00 o Providers come to see babies at [facility] o Does NOT accept Medicaid o Free prenatal information session Tuesdays at 4:45pm . [facility] Luna Kitchens, MD; [place], [place]; [place], [place]; [place], [place] o [address] [place] [address] o [phone] o Mon-Fri 7:30-5:30 o Babies seen by [facility] providers . [facility] Children[facility] Doctor o [address], [place], [place]  [address] o [phone]   Fax - [phone]  [place] [phone] & [phone]) . [facility] Alphonsa Overall, MD o [address] [place], [place] [address] o [phone] o Mon-Thur 8:00-6:00 o Providers come to see babies at [facility] o Accepting Medicaid . [facility] Zenon Mayo, NP; Cyndia Bent, MD; [place], [place]; [place], [place] o [address] [place], [place] [address] o [phone] o Mon-Thur 7:30-7:30, Fri 7:30-4:30 o Babies seen by [facility] providers o Accepting Medicaid . [facility] Cheryle Horsfall, MD; Janene Harvey, NP; Vonita Moss, MD o [address], [place], [place] [address] o [phone] o Mon-Fri 8:30-5:00, Sat 8:30-12:00 o Providers come to see babies at [facility] o Accepting Medicaid o Must have "Meet & Greet" appointment at office prior to delivery . [facility] [place] ([facility]) Llana Aliment, MD; Earlene Plater, MD; Lucretia Roers, MD o [address], [place], [place] [address] o [phone] o Mon-Wed 8:00-6:00, Thur-Fri 8:00-5:00, Sat 9:00-12:00 o Providers come to see babies at [facility] o Does NOT accept Medicaid o Only accepting siblings of current patients . [facility] [place]  o [address], [place], [place]  [address] o [phone]   Fax - [phone] . [facility] o [phone] [address], [place], [place]  [address] o [phone]   Fax - [phone]  [place] [phone] & [phone]) . Nature conservation officer at [facility] o [place], DO; [place], DO o [address] [place], [place] [address] o [phone] o Mon-Fri 7:00-5:00 o Babies seen by [facility] providers o Does NOT accept Medicaid . [facility] Ellis Savage, MD; [place], [place]; [employer], [place] o [address], [place], [place] [address] o [phone] o Mon-Fri 8:00-5:00 o Babies seen by [facility] providers o Accepting Medicaid . [facility] - [address] Franne Forts, MD; [place], [place]; Micco, NP; [place], [place] o [address] [place], [place], [place] [address]  o [phone] o Mon-Fri 8:00-5:00 o Babies seen by providers at [facility] o Accepting [facility] [phone]) . [facility] [place], [place] o [address] [place], [place] [address] o [phone] o Mon-Fri 8:00-5:00 o Babies seen by [facility] providers o Does NOT accept Medicaid o Limited availability, please call early in hospitalization to schedule follow-up . [facility] Jolee Ewing, PA; Eddie Candle, MD; [place], MD; [place], [place]; Constance Goltz, MD; [place], [place] o [facility] [address], [place], [place] [address] o [phone] o Mon-Fri 8:30-5:00, Sat 9:00-12:00 o Babies seen by providers at  [facility] o Accepting Medicaid o Please register online then schedule online or call office o [email or web address] . [facility]) Samuella Bruin, NP; Lucianne Muss, MD; Lanier Clam, PA o [address], [place], [place] [address] o [phone] o Mon-Fri 8:00-5:00 o Babies seen by providers at [facility] o Accepting Medicaid . [facility] [place] ([facility]) Sharin Mons, MD; Reed Breech, NP; Shelva Majestic, MD o [address], [place], [place] [address] o [phone] o Mon-Fri 8:00-5:30, Sat&Sun by appointment (phones open at 8:30) o Babies seen by [facility] providers o Accepting Medicaid o Must be a first-time baby or sibling of current patient . [facility] [address], [place], [place]  [address] o [phone]   Fax - [phone]  [place] [phone] & [phone]) . [facility] Medicine o [place], [place]; [place], [place]; Dimple Casey, MD; [place], [place]; Carolyne Fiscal, MD o [address] [place], [place] [address] o [phone] o Mon-Thur 8:00-7:00, Fri 8:00-5:00, Sat 8:00-12:00, Sun 9:00-12:00 o Babies seen by [facility] providers o Accepting Medicaid . [facility] at [facility], MD; Gaynell Face, MD; [facility], MD o [address], [place], [place] [address] o [phone] o Mon-Thur 8:00-5:00 o Babies seen by providers at [facility] o Accepting Medicaid . [facility] [place] Gwenlyn Saran, MD; Coe[place]Go[place], MD; Madilyn Fireman, MD; Melvyn Neth, MD; List, MD; Lazarus Salines, MD; Gaynell Face, MD; Berneda Rose, MD; Flora Lipps, MD; Beryl Meager, MD; Luther Redo, MD; Lavonia Drafts, MD; Kellie Simmering, MD o [address] [place] [place], [place] [address] o [phone] o Mon-Fri 8:00-5:30, Sat (Oct.-Mar.) 9:00-1:00 o Babies seen by providers at [facility] o Accepting Medicaid o Must fill out  new patient packet, available online at [email or web address] . [facility] [place] [facility] at [facility]) Simone Curia, NP; Tiburcio Pea, NP; Tresa Endo, NP; Whitney Post, MD; [place], [place]; Hennie Duos, MD; Wynne Dust, MD; Kavin Leech, NP o [address], [place], [place] [address] o [phone] o Mon-Thur 8:00-5:30, Fri 8:00-5:00 o Babies seen by providers at [facility] o Accepting [facility] [phone]) . [facility] o [place], [place]; [place], MD; Tanya Nones, MD; [place], [place] o [address] [place], [place] [address] o [phone] o Mon-Fri 8:00-5:00 o Babies seen by providers at [facility] o Accepting [facility] [phone]) . [facility] at [facility] o [place], DO; Lenise Arena, MD; [place], [place] o [address], [place], [place] [address] o [phone] o Mon-Fri 8:00-5:00 o Babies seen by providers at [facility] o Does NOT accept Medicaid o Limited appointment availability, please call early in hospitalization  . Nature conservation officer at [facility] o [place], [place];  [place], MD o [address], [place], [place] [address] o [phone] o Mon-Fri 8:00-5:00 o Babies seen by [facility] providers o Does NOT accept Medicaid . [facility] [place] o Cameron, MD; MacDonald, MD; Michaels, PA; Nayak, MD o [address] [place] [address], [place], [place] [address] o [phone] o Mon-Fri 8:00-5:00 o After hours clinic ([address] [place], [place] [address]) [phone] Mon-Fri 5:00-8:00, Sat 12:00-6:00, Sun 10:00-4:00 o Babies seen by [facility] providers o Accepting Medicaid . [facility] [place] o [address], [place], [place]  [address] o [phone]   Fax - [phone]  [place] ([phone]) . [facility] [place] o Andy, MD o [address], [place], [place] [address] o [phone] o Mon-Fri 8:00-5:00 o [facility] by [facility] providers o Does NOT accept Medicaid . [facility] [place] ([facility] [place]) o Eksir, MD o [address], [place], [place] [address] o [phone] o Mon-Thur 8:00-7:00, Fri 8:00-5:00, Sat 8:00-12:00 o Babies seen by providers at [facility] o Accepting Medicaid - but does not have vaccinations in office (must be received elsewhere) o Limited availability, please call early in hospitalization  [place] ([phone]) . [facility]  o Charlene Flemming, MD o [address], [place] [address] o [phone]  Fax [phone]   

## 2020-05-21 NOTE — Progress Notes (Signed)
Addendum: Jeanene Erb [place] at [phone] and left message for [place] report to be faxed to [place] at [phone].  Called back and spoke with someone and asked it be faxed today.  Ailie Gage,RN

## 2020-05-24 ENCOUNTER — Encounter: Payer: Self-pay | Admitting: *Deleted

## 2020-05-25 ENCOUNTER — Other Ambulatory Visit: Payer: Self-pay | Admitting: Lactation Services

## 2020-05-25 DIAGNOSIS — Z3480 Encounter for supervision of other normal pregnancy, unspecified trimester: Secondary | ICD-10-CM

## 2020-05-26 ENCOUNTER — Other Ambulatory Visit: Payer: Medicaid Other

## 2020-06-02 ENCOUNTER — Other Ambulatory Visit: Payer: Self-pay

## 2020-06-02 ENCOUNTER — Ambulatory Visit (INDEPENDENT_AMBULATORY_CARE_PROVIDER_SITE_OTHER): Payer: Medicaid Other | Admitting: Certified Nurse Midwife

## 2020-06-02 VITALS — BP 98/64 | HR 94 | Wt 117.1 lb

## 2020-06-02 DIAGNOSIS — Z3483 Encounter for supervision of other normal pregnancy, third trimester: Secondary | ICD-10-CM

## 2020-06-02 DIAGNOSIS — Z3A31 31 weeks gestation of pregnancy: Secondary | ICD-10-CM

## 2020-06-02 NOTE — Progress Notes (Signed)
   PRENATAL VISIT NOTE  Subjective:  Kari Schmidt is a 25 y.o. G2P1001 at [redacted]w[redacted]d being seen today for ongoing prenatal care.  She is currently monitored for the following issues for this low-risk pregnancy and has Supervision of other normal pregnancy, antepartum on their problem list.  Patient reports occasional pain when the baby kicks in the upper left quadrant of her abdomen, pain is not constant, says it is mild until baby kicks right there. Thinks her son elbowed her there a few weeks ago.  Contractions: Not present. Vag. Bleeding: None.  Movement: Present. Denies leaking of fluid.   The following portions of the patient's history were reviewed and updated as appropriate: allergies, current medications, past family history, past medical history, past social history, past surgical history and problem list.   Objective:   Vitals:   06/02/20 1454  BP: 98/64  Pulse: 94  Weight: 117 lb 1.6 oz (53.1 kg)    Fetal Status: Fetal Heart Rate (bpm): 152 Fundal Height: 31 cm Movement: Present     General:  Alert, oriented and cooperative. Patient is in no acute distress.  Skin: Skin is warm and dry. No rash noted.   Cardiovascular: Normal heart rate noted  Respiratory: Normal respiratory effort, no problems with respiration noted  Abdomen: Soft, gravid, appropriate for gestational age.  Mild tenderness on palpation of left upper to mid quadrant. No bruising or guarding noted. Pain/Pressure: Present     Pelvic: Cervical exam deferred        Extremities: Normal range of motion.  Edema: None  Mental Status: Normal mood and affect. Normal behavior. Normal judgment and thought content.   Assessment and Plan:  Pregnancy: G2P1001 at [redacted]w[redacted]d 1. Encounter for supervision of other normal pregnancy in third trimester - Pt doing well overall, feeling vigorous fetal movement  2. [redacted] weeks gestation of pregnancy - Routine OB care - Suggested using a heating pad and muscle cream on the sore spot and to let us  know if it hasn't resolved by her next appointment. No associating symptoms, pain is not constant. - Anticipatory guidance on future visits - Pregnancy Navigator in to see pt  Preterm labor symptoms and general obstetric precautions including but not limited to vaginal bleeding, contractions, leaking of fluid and fetal movement were reviewed in detail with the patient. Please refer to After Visit Summary for other counseling recommendations.   Return in about 3 weeks (around 06/23/2020) for IN-PERSON, LOB.  Future Appointments  Date Time Provider Department Center  06/23/2020  8:20 AM [facility]  06/23/2020  9:55 AM Bernerd Limbo, CNM [facility]    Bernerd Limbo, CNM

## 2020-06-07 ENCOUNTER — Encounter: Payer: Self-pay | Admitting: *Deleted

## 2020-06-08 ENCOUNTER — Telehealth: Payer: Self-pay | Admitting: Lactation Services

## 2020-06-08 NOTE — Telephone Encounter (Signed)
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   Reviewed with patient that she carries the gene, not that she has the disease Recommended she call [facility] at [phone] to set up a Telephone Genetic Counseling Session to discuss results.   Reviewed it is recommended that partner be tested also to see if he carries the same gene. Reviewed there are saliva kits in the office that she can obtain to do the testing.   Patient voiced understanding and has no questions or concerns at this time.

## 2020-06-09 ENCOUNTER — Encounter: Payer: Self-pay | Admitting: *Deleted

## 2020-06-10 ENCOUNTER — Encounter: Payer: Self-pay | Admitting: Medical

## 2020-06-10 DIAGNOSIS — D563 Thalassemia minor: Secondary | ICD-10-CM | POA: Insufficient documentation

## 2020-06-19 ENCOUNTER — Inpatient Hospital Stay (HOSPITAL_COMMUNITY)
Admission: AD | Admit: 2020-06-19 | Discharge: 2020-06-19 | Disposition: A | Discharge status: Home / Self Care | Payer: Medicaid Other | Attending: Obstetrics and Gynecology | Admitting: Obstetrics and Gynecology

## 2020-06-19 ENCOUNTER — Encounter (HOSPITAL_COMMUNITY): Payer: Self-pay | Admitting: Obstetrics and Gynecology

## 2020-06-19 ENCOUNTER — Other Ambulatory Visit: Payer: Self-pay

## 2020-06-19 DIAGNOSIS — Z3A33 33 weeks gestation of pregnancy: Secondary | ICD-10-CM | POA: Insufficient documentation

## 2020-06-19 DIAGNOSIS — O26893 Other specified pregnancy related conditions, third trimester: Secondary | ICD-10-CM | POA: Diagnosis present

## 2020-06-19 DIAGNOSIS — R81 Glycosuria: Secondary | ICD-10-CM

## 2020-06-19 DIAGNOSIS — Z3480 Encounter for supervision of other normal pregnancy, unspecified trimester: Secondary | ICD-10-CM

## 2020-06-19 DIAGNOSIS — O23593 Infection of other part of genital tract in pregnancy, third trimester: Secondary | ICD-10-CM | POA: Insufficient documentation

## 2020-06-19 DIAGNOSIS — B9689 Other specified bacterial agents as the cause of diseases classified elsewhere: Secondary | ICD-10-CM | POA: Insufficient documentation

## 2020-06-19 LAB — URINALYSIS, ROUTINE W REFLEX MICROSCOPIC
Bacteria, UA: NONE SEEN
Bilirubin Urine: NEGATIVE
Glucose, UA: 50 mg/dL — AB
Hgb urine dipstick: NEGATIVE
Ketones, ur: NEGATIVE mg/dL
Nitrite: NEGATIVE
Protein, ur: NEGATIVE mg/dL
Specific Gravity, Urine: 1.005 (ref 1.005–1.030)
pH: 7 (ref 5.0–8.0)

## 2020-06-19 LAB — WET PREP, GENITAL
Sperm: NONE SEEN
Trich, Wet Prep: NONE SEEN
Yeast Wet Prep HPF POC: NONE SEEN

## 2020-06-19 LAB — AMNISURE RUPTURE OF MEMBRANE (ROM) NOT AT ARMC: Amnisure ROM: NEGATIVE

## 2020-06-19 LAB — POCT FERN TEST: POCT Fern Test: NEGATIVE

## 2020-06-19 MED ORDER — METRONIDAZOLE 500 MG PO TABS: 500.0000 mg | ORAL_TABLET | Freq: Two times a day (BID) | ORAL | 0 refills | Status: AC

## 2020-06-19 NOTE — MAU Provider Note (Addendum)
Chief Complaint:  Rupture of Membranes    HPI: Kari Schmidt is a 25 y.o. G2P1001 at [redacted]w[redacted]d via certain LMP who presents to maternity admissions for evaluation of leakage of fluid.   She reports around 9 PM last night, 4/22, she felt a "pop/gush" and subsequently noticed white discharge/thin liquid on her underwear.  Did not soak through, however had to change underwear.  After that time, she has not had any further discharge or leakage.  During this time she is otherwise been well.  Denies any concurrent vaginal bleeding, decreased fetal movement, fever, recent illness, or abdominal pain/contractions.  Denies any dysuria, urinary frequency, polydipsia, fatigue, vaginal itching, or irritation.  Not sexually active in the last 24 hours.  Receives prenatal care at [facility], low risk.  Normal anatomy scan at 19 weeks.  Pregnancy Course:   Past Medical History:  Diagnosis Date  . Medical history non-contributory   . PROM (premature rupture of membranes) 10/06/2018  . SVD (spontaneous vaginal delivery) 10/07/2018   OB History  Gravida Para Term Preterm AB Living  2 1 1     1   SAB IAB Ectopic Multiple Live Births        0 1    # Outcome Date GA Lbr Len/2nd Weight Sex Delivery Anes PTL Lv  2 Current           1 Term 10/07/18 [redacted]w[redacted]d 13:43 / 00:46 3189 g M Vag-Spont EPI  LIV   Past Surgical History:  Procedure Laterality Date  . NO PAST SURGERIES     Family History  Problem Relation Age of Onset  . Asthma Mother    Social History   Tobacco Use  . Smoking status: Never Smoker  . Smokeless tobacco: Never Used  Vaping Use  . Vaping Use: Never used  Substance Use Topics  . Alcohol use: Not Currently    Comment: 3 years ago  . Drug use: Never   No Known Allergies Medications Prior to Admission  Medication Sig Dispense Refill Last Dose  . Prenatal Vit-Fe Fumarate-FA (WESTAB PLUS) 27-1 MG TABS Take 1 tablet by mouth daily.   06/18/2020 at Unknown time  . ibuprofen (ADVIL) 600 MG tablet Take 1  tablet (600 mg total) by mouth every 6 (six) hours. (Patient not taking: No sig reported) 30 tablet 0   . terconazole (TERAZOL 7) 0.4 % vaginal cream Place 1 applicator vaginally at bedtime. (Patient not taking: No sig reported)       I have reviewed patient's Past Medical Hx, Surgical Hx, Family Hx, Social Hx, medications and allergies.   ROS:  Review of Systems  Constitutional: Negative for fatigue and fever.  Gastrointestinal: Negative for abdominal pain.  Genitourinary: Negative for difficulty urinating, dysuria and vaginal bleeding.  Musculoskeletal: Negative for back pain.    Physical Exam   Patient Vitals for the past 24 hrs:  BP Temp Temp src Resp SpO2 Height Weight  06/19/20 1259 107/70 97.9 F (36.6 C) Oral 19 99 % -- --  06/19/20 1254 -- -- -- -- -- 5\' 2"  (1.575 m) 54.5 kg    Constitutional: Well-developed, well-nourished female in no acute distress.  Cardiovascular: normal rate  Respiratory: normal effort GI: Abd soft, non-tender, gravid appropriate for gestational age.  MS: Extremities nontender, no edema, normal ROM Neurologic: Alert and oriented x 4.  Sterile speculum exam: Normal external female genitalia. Vaginal vault showing pink mucosa and normal rugae.  No vaginal fluid pooling or bleeding seen.  White thin discharge present.  Wet prep, GC/CH, AmniSure collected.    Cervical exam: External os 1 cm, internal os closed. No cervical motion tenderness.   Chaperoned by CMA and Rayfield Citizen, CNM.   Dilation: Closed Exam by:: Chauncey Mann DO  Fetal Tracing: Baseline: 140  Variability: Moderate Accelerations: 15 x15  Decelerations: None  Toco: irregular, 5-9   Labs: Results for orders placed or performed during the hospital encounter of 06/19/20 (from the past 24 hour(s))  Urinalysis, Routine w reflex microscopic Urine, Clean Catch     Status: Abnormal   Collection Time: 06/19/20  1:00 PM  Result Value Ref Range   Color, Urine STRAW (A) YELLOW   APPearance  CLEAR CLEAR   Specific Gravity, Urine 1.005 1.005 - 1.030   pH 7.0 5.0 - 8.0   Glucose, UA 50 (A) NEGATIVE mg/dL   Hgb urine dipstick NEGATIVE NEGATIVE   Bilirubin Urine NEGATIVE NEGATIVE   Ketones, ur NEGATIVE NEGATIVE mg/dL   Protein, ur NEGATIVE NEGATIVE mg/dL   Nitrite NEGATIVE NEGATIVE   Leukocytes,Ua SMALL (A) NEGATIVE   RBC / HPF 0-5 0 - 5 RBC/hpf   WBC, UA 0-5 0 - 5 WBC/hpf   Bacteria, UA NONE SEEN NONE SEEN   Squamous Epithelial / LPF 6-10 0 - 5  Fern Test     Status: Normal   Collection Time: 06/19/20  1:38 PM  Result Value Ref Range   POCT Fern Test Negative = intact amniotic membranes   Wet prep, genital     Status: Abnormal   Collection Time: 06/19/20  2:11 PM   Specimen: Cervix  Result Value Ref Range   Yeast Wet Prep HPF POC NONE SEEN NONE SEEN   Trich, Wet Prep NONE SEEN NONE SEEN   Clue Cells Wet Prep HPF POC PRESENT (A) NONE SEEN   WBC, Wet Prep HPF POC MANY (A) NONE SEEN   Sperm NONE SEEN   Amnisure rupture of membrane (rom)not at [facility]     Status: None   Collection Time: 06/19/20  2:11 PM  Result Value Ref Range   Amnisure ROM NEGATIVE     Imaging:  No results found.  MAU Course: Orders Placed This Encounter  Procedures  . Wet prep, genital  . Urinalysis, Routine w reflex microscopic Urine, Clean Catch  . Amnisure rupture of membrane (rom)not at [facility]  . Fern Test  . Discharge patient Discharge disposition: 01-Home or Self Care; Discharge patient date: 06/19/2020   Meds ordered this encounter  Medications  . metroNIDAZOLE (FLAGYL) 500 MG tablet    Sig: Take 1 tablet (500 mg total) by mouth 2 (two) times daily for 7 days.    Dispense:  14 tablet    Refill:  0    MDM:  Assessment/Plan: 1. Bacterial vaginosis   2. Supervision of other normal pregnancy, antepartum   3. Glucosuria    25 yo G2P1 female at [redacted]w[redacted]d via LMP with low risk pregnancy presenting for evaluation of vaginal discharge with concern for ROM.  No vaginal pooling seen on exam,  fern test and AmniSure negative.  Vaginal discharge and wet prep consistent with BV, flagyl sent. Reactive fetal heart tracing and without painful contractions and closed cervix. F/u Gc/ch.   Provided reassurance and discussed findings with patient, continue prenatal care outpatient (Appt 4/27).  Discharge home in stable condition. MAU precautions discussed.      Allergies as of 06/19/2020   No Known Allergies     Medication List    STOP taking these medications  ibuprofen 600 MG tablet Commonly known as: ADVIL   terconazole 0.4 % vaginal cream Commonly known as: TERAZOL 7     TAKE these medications   metroNIDAZOLE 500 MG tablet Commonly known as: FLAGYL Take 1 tablet (500 mg total) by mouth 2 (two) times daily for 7 days.   WesTab Plus 27-1 MG Tabs Take 1 tablet by mouth daily.       Leticia Penna, DO  Family Medicine PGY-3    Attestation of Supervision of Student:  I confirm that I have verified the information documented in the resident student's note and that I have also personally reperformed the history, physical exam and all medical decision making activities.  I have verified that all services and findings are accurately documented in this student's note; and I agree with management and plan as outlined in the documentation. I have also made any necessary editorial changes.   Rolm Bookbinder, CNM [facility] [employer], [facility] 06/19/2020 3:52 PM

## 2020-06-19 NOTE — MAU Note (Signed)
Presents c/o LOF that began last night at 2130, "felt something pop".  Reports no leaking today, it's stopped.  Denies VB.  Endorses +FM.

## 2020-06-19 NOTE — Discharge Instructions (Signed)
It was wonderful to see you today.  Your membranes are intact.  You have a vaginal infection called bacterial vaginosis, we have sent in a medication called Flagyl that will treat that for 7 days.  If you have any further leakage of fluid, vaginal bleeding, severe abdominal pain/contractions, or decreased fetal movement please return.

## 2020-06-21 ENCOUNTER — Other Ambulatory Visit: Payer: Self-pay | Admitting: *Deleted

## 2020-06-21 DIAGNOSIS — Z3480 Encounter for supervision of other normal pregnancy, unspecified trimester: Secondary | ICD-10-CM

## 2020-06-21 LAB — GC/CHLAMYDIA PROBE AMP (~~LOC~~) NOT AT ARMC
Chlamydia: NEGATIVE
Comment: NEGATIVE
Comment: NORMAL
Neisseria Gonorrhea: NEGATIVE

## 2020-06-23 ENCOUNTER — Other Ambulatory Visit: Payer: Self-pay

## 2020-06-23 ENCOUNTER — Encounter: Payer: Self-pay | Admitting: Certified Nurse Midwife

## 2020-06-23 ENCOUNTER — Other Ambulatory Visit: Payer: Medicaid Other

## 2020-06-23 ENCOUNTER — Ambulatory Visit (INDEPENDENT_AMBULATORY_CARE_PROVIDER_SITE_OTHER): Payer: Medicaid Other | Admitting: Certified Nurse Midwife

## 2020-06-23 VITALS — BP 104/75 | HR 83 | Wt 122.0 lb

## 2020-06-23 DIAGNOSIS — Z3480 Encounter for supervision of other normal pregnancy, unspecified trimester: Secondary | ICD-10-CM

## 2020-06-23 DIAGNOSIS — Z3A34 34 weeks gestation of pregnancy: Secondary | ICD-10-CM

## 2020-06-23 DIAGNOSIS — Z3403 Encounter for supervision of normal first pregnancy, third trimester: Secondary | ICD-10-CM

## 2020-06-23 NOTE — Patient Instructions (Signed)

## 2020-06-23 NOTE — Progress Notes (Signed)
Occasional pressure in pelvic but denies any pain

## 2020-06-24 LAB — CBC
Hematocrit: 36.3 % (ref 34.0–46.6)
Hemoglobin: 12.1 g/dL (ref 11.1–15.9)
MCH: 28.5 pg (ref 26.6–33.0)
MCHC: 33.3 g/dL (ref 31.5–35.7)
MCV: 85 fL (ref 79–97)
Platelets: 197 10*3/uL (ref 150–450)
RBC: 4.25 x10E6/uL (ref 3.77–5.28)
RDW: 12.8 % (ref 11.7–15.4)
WBC: 5.6 10*3/uL (ref 3.4–10.8)

## 2020-06-24 LAB — GLUCOSE TOLERANCE, 2 HOURS W/ 1HR
Glucose, 1 hour: 72 mg/dL (ref 65–179)
Glucose, 2 hour: 59 mg/dL — ABNORMAL LOW (ref 65–152)
Glucose, Fasting: 75 mg/dL (ref 65–91)

## 2020-06-24 LAB — HIV ANTIBODY (ROUTINE TESTING W REFLEX): HIV Screen 4th Generation wRfx: NONREACTIVE

## 2020-06-24 LAB — RPR: RPR Ser Ql: NONREACTIVE

## 2020-06-24 NOTE — Progress Notes (Signed)
   PRENATAL VISIT NOTE  Subjective:  Kari Schmidt is a 25 y.o. G2P1001 at [redacted]w[redacted]d being seen today for ongoing prenatal care.  She is currently monitored for the following issues for this low-risk pregnancy and has Supervision of other normal pregnancy, antepartum and Alpha thalassemia silent carrier on their problem list.  Patient reports occasional pelvic pressure when walking.  Contractions: Not present. Vag. Bleeding: None.  Movement: Present. Denies leaking of fluid.   The following portions of the patient's history were reviewed and updated as appropriate: allergies, current medications, past family history, past medical history, past social history, past surgical history and problem list.   Objective:   Vitals:   06/23/20 1013  BP: 104/75  Pulse: 83  Weight: 122 lb (55.3 kg)    Fetal Status: Fetal Heart Rate (bpm): 138 Fundal Height: 31 cm Movement: Present     General:  Alert, oriented and cooperative. Patient is in no acute distress.  Skin: Skin is warm and dry. No rash noted.   Cardiovascular: Normal heart rate noted  Respiratory: Normal respiratory effort, no problems with respiration noted  Abdomen: Soft, gravid, appropriate for gestational age.  Pain/Pressure: Present     Pelvic: Cervical exam deferred        Extremities: Normal range of motion.  Edema: None  Mental Status: Normal mood and affect. Normal behavior. Normal judgment and thought content.   Assessment and Plan:  Pregnancy: G2P1001 at [redacted]w[redacted]d 1. Encounter for supervision of low-risk first pregnancy in third trimester - Doing well, feeling regular and vigorous fetal movement  2. [redacted] weeks gestation of pregnancy - Routine OB care - Anticipatory guidance given re GBS test at next appointment - [place] MFM OB LIMITED; Future ordered for S<D  Preterm labor symptoms and general obstetric precautions including but not limited to vaginal bleeding, contractions, leaking of fluid and fetal movement were reviewed in detail with  the patient. Please refer to After Visit Summary for other counseling recommendations.   Return in about 2 weeks (around 07/07/2020) for IN-PERSON, LOB w GBS.  Future Appointments  Date Time Provider Department Center  07/05/2020  1:00 PM [facility] NURSE [facility]  07/05/2020  1:15 PM [facility]  07/12/2020  3:55 PM Rasch, Harolyn Rutherford, NP [facility]    Bernerd Limbo, CNM

## 2020-07-05 ENCOUNTER — Other Ambulatory Visit: Payer: Self-pay

## 2020-07-05 ENCOUNTER — Encounter (HOSPITAL_COMMUNITY): Payer: Self-pay | Admitting: Family Medicine

## 2020-07-05 ENCOUNTER — Other Ambulatory Visit: Payer: Self-pay | Admitting: Certified Nurse Midwife

## 2020-07-05 ENCOUNTER — Encounter: Payer: Self-pay | Admitting: *Deleted

## 2020-07-05 ENCOUNTER — Ambulatory Visit: Payer: Medicaid Other | Admitting: *Deleted

## 2020-07-05 ENCOUNTER — Inpatient Hospital Stay (HOSPITAL_COMMUNITY)
Admission: AD | Admit: 2020-07-05 | Discharge: 2020-07-06 | Disposition: A | Discharge status: Home / Self Care | Payer: Medicaid Other | Attending: Family Medicine | Admitting: Family Medicine

## 2020-07-05 ENCOUNTER — Ambulatory Visit: Payer: Medicaid Other | Attending: Certified Nurse Midwife

## 2020-07-05 DIAGNOSIS — Z3A35 35 weeks gestation of pregnancy: Secondary | ICD-10-CM

## 2020-07-05 DIAGNOSIS — O26893 Other specified pregnancy related conditions, third trimester: Secondary | ICD-10-CM | POA: Diagnosis present

## 2020-07-05 DIAGNOSIS — D569 Thalassemia, unspecified: Secondary | ICD-10-CM

## 2020-07-05 DIAGNOSIS — O26899 Other specified pregnancy related conditions, unspecified trimester: Secondary | ICD-10-CM

## 2020-07-05 DIAGNOSIS — Z3480 Encounter for supervision of other normal pregnancy, unspecified trimester: Secondary | ICD-10-CM

## 2020-07-05 DIAGNOSIS — O99013 Anemia complicating pregnancy, third trimester: Secondary | ICD-10-CM

## 2020-07-05 DIAGNOSIS — O365930 Maternal care for other known or suspected poor fetal growth, third trimester, not applicable or unspecified: Secondary | ICD-10-CM | POA: Diagnosis present

## 2020-07-05 DIAGNOSIS — Z79899 Other long term (current) drug therapy: Secondary | ICD-10-CM | POA: Diagnosis not present

## 2020-07-05 DIAGNOSIS — O26843 Uterine size-date discrepancy, third trimester: Secondary | ICD-10-CM

## 2020-07-05 DIAGNOSIS — Z3A34 34 weeks gestation of pregnancy: Secondary | ICD-10-CM

## 2020-07-05 DIAGNOSIS — Z3689 Encounter for other specified antenatal screening: Secondary | ICD-10-CM | POA: Diagnosis not present

## 2020-07-05 DIAGNOSIS — Z368A Encounter for antenatal screening for other genetic defects: Secondary | ICD-10-CM | POA: Diagnosis not present

## 2020-07-05 DIAGNOSIS — R109 Unspecified abdominal pain: Secondary | ICD-10-CM | POA: Diagnosis not present

## 2020-07-05 DIAGNOSIS — O0933 Supervision of pregnancy with insufficient antenatal care, third trimester: Secondary | ICD-10-CM | POA: Insufficient documentation

## 2020-07-05 MED ORDER — DICYCLOMINE HCL 10 MG PO CAPS
10.0000 mg | ORAL_CAPSULE | Freq: Once | ORAL | Status: AC
  Administered 2020-07-05: 10 mg via ORAL
  Filled 2020-07-05: qty 1

## 2020-07-05 MED ORDER — SIMETHICONE 80 MG PO CHEW
80.0000 mg | CHEWABLE_TABLET | Freq: Once | ORAL | Status: AC
  Administered 2020-07-05: 80 mg via ORAL
  Filled 2020-07-05: qty 1

## 2020-07-05 MED ORDER — TERBUTALINE SULFATE 1 MG/ML IJ SOLN
0.2500 mg | Freq: Once | INTRAMUSCULAR | Status: AC
  Administered 2020-07-05: 0.25 mg via SUBCUTANEOUS
  Filled 2020-07-05: qty 1

## 2020-07-05 NOTE — MAU Note (Signed)
Pt presents with contractions that started this pm, denies bleeding or ROM

## 2020-07-05 NOTE — Procedures (Signed)
Kari Schmidt [birth year] [redacted]w[redacted]d  Fetus A Non-Stress Test Interpretation for 07/05/20  Indication: IUGR  Fetal Heart Rate A Mode: External Baseline Rate (A): 140 bpm Variability: Moderate Accelerations: 15 x 15 Decelerations: None Multiple birth?: No  Uterine Activity Mode: Palpation,Toco Contraction Frequency (min): none Resting Tone Palpated: Relaxed Resting Time: Adequate  Interpretation (Fetal Testing) Nonstress Test Interpretation: Reactive Overall Impression: Reassuring for gestational age Comments: Dr.Shankar reviewed tracing

## 2020-07-06 ENCOUNTER — Other Ambulatory Visit: Payer: Self-pay | Admitting: *Deleted

## 2020-07-06 DIAGNOSIS — R109 Unspecified abdominal pain: Secondary | ICD-10-CM

## 2020-07-06 DIAGNOSIS — O26849 Uterine size-date discrepancy, unspecified trimester: Secondary | ICD-10-CM

## 2020-07-06 DIAGNOSIS — Z3A35 35 weeks gestation of pregnancy: Secondary | ICD-10-CM

## 2020-07-06 DIAGNOSIS — O26893 Other specified pregnancy related conditions, third trimester: Secondary | ICD-10-CM | POA: Diagnosis not present

## 2020-07-06 LAB — URINALYSIS, ROUTINE W REFLEX MICROSCOPIC
Bilirubin Urine: NEGATIVE
Glucose, UA: NEGATIVE mg/dL
Ketones, ur: 20 mg/dL — AB
Nitrite: NEGATIVE
Protein, ur: NEGATIVE mg/dL
Specific Gravity, Urine: 1.015 (ref 1.005–1.030)
pH: 6 (ref 5.0–8.0)

## 2020-07-06 NOTE — Discharge Instructions (Signed)
Preventing Foodborne Illness Foodborne illness, also called food poisoning, is an illness caused by eating food or drinking liquid that is contaminated with harmful bacteria, viruses, parasites, or poisons (toxins). Bacteria and viruses cause most foodborne illnesses. Often times, foodborne illnesses go away in a few days without treatment. However, if you have a severe case, treatment may be needed. How can this condition affect me? Common symptoms of foodborne illness are sudden nausea, vomiting, abdominal pain, diarrhea, and fever. Most cases of foodborne illness can be treated at home. You should:  Drink enough fluid to keep your urine pale yellow.  Avoid foods and drinks that are hard to digest or that irritate your digestive system. These include fatty foods, dairy foods, caffeine, sugary foods, and alcohol.  Gradually start eating foods that are easy on your digestive system. These include rice, potatoes, toast, applesauce, broth, and bananas. Foodborne illnesses range in severity. They can cause unpleasant symptoms to dangerous complications that can be deadly, such as dehydration and bleeding in your digestive system. Severe cases of foodborne illness may need to be treated with IV fluids and other medicines. Antibiotic medicines may be given for severe illness caused by bacteria. What can increase my risk? People at higher risk for serious foodborne illness include:  Children.  Older adults.  Pregnant women.  People with weak immune systems. What actions can I take to prevent this? Eat safe foods  Check foods before buying them. Do not buy jars or cans that are dented, cracked, or have bulging or loose lids. Do not buy foods if the packaging is open, torn, or crushed.  Take extra care with foods that can spoil (perishable foods) like meat and dairy products. Germs can start to grow in these foods whether they are raw, cooked, or prepared. Make sure you: ? Check labels to see if  the food needs to be refrigerated. ? Follow the dates on the package to use or freeze the product by. ? Freeze or refrigerate all perishable foods within two hours. In the summer, freeze or refrigerate perishable foods within one hour. Keep your refrigerator set to 57F (4.4C) or colder. Prepare food properly  Check the cooking instructions for all foods. Heating kills many germs and toxins. Use a meat thermometer to make sure you have cooked meats to the right temperature to make them safe. ? Cook whole cuts of beef, pork, veal, and lamb to no less than 145F (62.8C). ? Cook [employer], including beef or pork, to no less than 160F (71.1C). ? Cook Environmental education officer, including chicken or [place], to no less than 165F (73.9C). ? Cook fin fish to no less than 145F (62.8C). ? Cook leftovers and dishes such as casseroles to no less than 165F (73.9C).  Wash fruits and vegetables under running water before you eat, cut, or cook them. If you are preparing fruits and vegetables in a place where running water is not safe, use bottled water to clean fruit and vegetables or remove peels before eating.  Keep raw meats, poultry, fish, shellfish, and eggs separate from other foods. Germs or toxins can pass from one food to another. ? Put these foods in separate plastic bags when shopping. ? Use separate utensils and cutting boards when preparing these foods.  When thawing foods, it is best to thaw them slowly in the refrigerator. You may also thaw foods in cold water or in the microwave if you cook them immediately after they are thawed. Take other precautions  If you will  not eat the food right after making it, keep hot food at 182F (60C) or warmer and cold foods at 82F (4.4C) or colder.  Do not drink unpasteurized milk or juice.   What other changes can I make?  Wash your hands for at least 20 seconds with soap and warm water before and after handling food. Always wash your hands after using the  bathroom or changing a diaper.  Wash all the surfaces where food is prepared with hot, soapy water. Wash all utensils, plates, cutting boards, pots, and pans. Do this before and after food preparation.  Keep animals away from surfaces where food is prepared.  If you travel to an area where foodborne illness is common: ? Do not drink tap water, use ice from tap water, or brush your teeth with tap water. Use only bottled water. ? Do not eat raw fruits or vegetables, unless you wash and peel them yourself. ? Do not eat foods sold by a street vendor. ? Do not eat rare meat, uncooked fish, or shellfish. ? Talk to your health care provider about bringing medicine with you to treat possible foodborne illness (traveler's diarrhea).      Where can I get more information? Learn more about foodborne illness from:  Centers for Disease Control and Prevention: WirelessImprov.gl.html  [employer] of [employer]: http://caldwell-sandoval.com/.aspx  http://stevens-collins.org/: https://www.WordRegistrar.at Contact a health care provider if:  You have persistent vomiting and cannot keep any fluids down.  You have diarrhea for more than two days.  You have severe abdominal pain.  You have bloody or tarry stools. Summary  Foodborne illness is caused by bacteria, viruses, parasites, or poisons. Symptoms include sudden nausea, vomiting, abdominal pain, diarrhea, and fever.  You can prevent foodborne illness by carefully preparing, storing, and cooking your food.  Make sure to wash your hands and all food preparation surfaces and utensils before and after handling food.  Avoid tap water and uncooked foods when traveling to areas where foodborne illness is common.  Call your health care provider if you have severe symptoms of foodborne illness, or if you have common symptoms that last more than  a few days. This information is not intended to replace advice given to you by your health care provider. Make sure you discuss any questions you have with your health care provider. Document Revised: 11/06/2018 Document Reviewed: 11/27/2017 Elsevier Patient Education  2021 [employer].

## 2020-07-06 NOTE — MAU Provider Note (Signed)
Chief Complaint:  Contractions   Event Date/Time   First Provider Initiated Contact with Patient 07/05/20 2319     HPI: Kari Schmidt is a 25 y.o. G2P1001 at [redacted]w[redacted]d who presents to maternity admissions reporting abdominal cramping, says sometimes it is constant, sometimes it comes and goes in waves. Unsure if the pain is contractions or not. Noted that this began after her MFM appointment today. Denies vaginal bleeding, leaking of fluid, decreased fetal movement, fever, falls, or recent illness.   Pregnancy Course: Receives care at [facility], being followed for possible fetal growth restriction  Past Medical History:  Diagnosis Date  . PROM (premature rupture of membranes) 10/06/2018  . SVD (spontaneous vaginal delivery) 10/07/2018   OB History  Gravida Para Term Preterm AB Living  2 1 1     1   SAB IAB Ectopic Multiple Live Births        0 1    # Outcome Date GA Lbr Len/2nd Weight Sex Delivery Anes PTL Lv  2 Current           1 Term 10/07/18 [redacted]w[redacted]d 13:43 / 00:46 7 lb 0.5 oz (3.189 kg) M Vag-Spont EPI  LIV   Past Surgical History:  Procedure Laterality Date  . NO PAST SURGERIES     Family History  Problem Relation Age of Onset  . Asthma Mother    Social History   Tobacco Use  . Smoking status: Never Smoker  . Smokeless tobacco: Never Used  Vaping Use  . Vaping Use: Never used  Substance Use Topics  . Alcohol use: Not Currently    Comment: 3 years ago  . Drug use: Never   No Known Allergies Medications Prior to Admission  Medication Sig Dispense Refill Last Dose  . Prenatal Vit-Fe Fumarate-FA (WESTAB PLUS) 27-1 MG TABS Take 1 tablet by mouth daily.   07/05/2020 at Unknown time   I have reviewed patient's Past Medical Hx, Surgical Hx, Family Hx, Social Hx, medications and allergies.   ROS:  Review of Systems  Constitutional: Negative for fatigue and fever.  HENT: Negative for congestion and sore throat.   Gastrointestinal: Positive for abdominal pain (began after stomach upset  began), nausea and vomiting (one episode around 7pm). Negative for diarrhea.  Genitourinary: Negative for vaginal bleeding and vaginal discharge.  Neurological: Negative for dizziness, syncope, light-headedness and headaches.   Physical Exam   Patient Vitals for the past 24 hrs:  BP Temp Temp src Pulse Resp SpO2 Height Weight  07/05/20 2242 111/74 98 F (36.7 C) Oral 97 15 100 % 5\' 2"  (1.575 m) 121 lb (54.9 kg)   Constitutional: Well-developed, well-nourished female in mild distress from pain.  Cardiovascular: normal rate & rhythm, no murmur Respiratory: normal effort, lung sounds clear throughout GI: Abd soft, non-tender, gravid appropriate for gestational age. Pos BS x 4 MS: Extremities nontender, no edema, normal ROM Neurologic: Alert and oriented x 4.  GU: no CVA tenderness Pelvic: NEFG, physiologic discharge, no blood, cervix unchanged from last check.  Dilation: 1 (internal closed) Exam by:: Wise Fees,CNM  Fetal Tracing: reactive Baseline: 140 Variability: moderate Accelerations: 15x15 Decelerations: none Toco: UI   Labs: Results for orders placed or performed during the hospital encounter of 07/05/20 (from the past 24 hour(s))  Urinalysis, Routine w reflex microscopic Urine, Clean Catch     Status: Abnormal   Collection Time: 07/05/20 11:47 PM  Result Value Ref Range   Color, Urine YELLOW YELLOW   APPearance HAZY (A) CLEAR   Specific Gravity,  Urine 1.015 1.005 - 1.030   pH 6.0 5.0 - 8.0   Glucose, UA NEGATIVE NEGATIVE mg/dL   Hgb urine dipstick MODERATE (A) NEGATIVE   Bilirubin Urine NEGATIVE NEGATIVE   Ketones, ur 20 (A) NEGATIVE mg/dL   Protein, ur NEGATIVE NEGATIVE mg/dL   Nitrite NEGATIVE NEGATIVE   Leukocytes,Ua SMALL (A) NEGATIVE   RBC / HPF 0-5 0 - 5 RBC/hpf   WBC, UA 6-10 0 - 5 WBC/hpf   Bacteria, UA RARE (A) NONE SEEN   Squamous Epithelial / LPF 0-5 0 - 5   Mucus PRESENT    Imaging:  [place] MFM FETAL BPP W/NONSTRESS  Result Date:  07/05/2020 ----------------------------------------------------------------------  OBSTETRICS REPORT                       (Signed Final 07/05/2020 03:18 pm) ---------------------------------------------------------------------- Patient Info  ID #:       [id number]                          D.O.B.:  1995-10-28 (24 yrs)  Name:       Kari Schmidt                         Visit Date: 07/05/2020 01:21 pm ---------------------------------------------------------------------- Performed By  Attending:        Noralee Space MD        Ref. Address:     [facility]                                                             [facility]                                                             Healthcare  Performed By:     Fayne Norrie BS,      Location:         [facility]                    RDMS, RVT                                [facility]  Referred By:      Judye Bos  Cloria Ciresi CNM ---------------------------------------------------------------------- Orders  #  Description                           Code        Ordered By  1  [place] MFM OB DETAIL +14 WK               [id number]    Josealberto Montalto  2  [place] MFM UA CORD DOPPLER                76820.02    Taliyah Watrous  3  [place] MFM FETAL BPP                      52778.2     Trinia Georgi     W/NONSTRESS ----------------------------------------------------------------------  #  Order #                     Accession #                Episode #  1  [id number]                   [id number]                 [id number]  2  [id number]                   [id number]                 [id number]  3  [id number]                   [id number]                 [id number] ---------------------------------------------------------------------- Indications  Uterine size-date discrepancy, third trimester O26.843  Late to prenatal care, third trimester         O09.33   Maternal thalassemia complicating              O99.013  pregnancy in third trimester (silent carrier)  Encounter for antenatal screening for other    Z36.8A  genetic defects  CF negative  Negative Quad Screen  [redacted] weeks gestation of pregnancy                Z3A.35 ---------------------------------------------------------------------- Fetal Evaluation  Num Of Fetuses:         1  Fetal Heart Rate(bpm):  152  Cardiac Activity:       Observed  Presentation:           Cephalic  Placenta:               Posterior  P. Cord Insertion:      Visualized, central  Amniotic Fluid  AFI FV:      Within normal limits  AFI Sum(cm)     %Tile       Largest Pocket(cm)  12.21           38          4.32  RUQ(cm)       RLQ(cm)       LUQ(cm)        LLQ(cm)  1.9           2.51          3.48           4.32 ---------------------------------------------------------------------- Biophysical Evaluation  Amniotic F.V:   Within normal limits  F. Tone:        Observed  F. Movement:    Observed                   N.S.T:          Reactive  F. Breathing:   Observed                   Score:          10/10 ---------------------------------------------------------------------- Biometry  BPD:        84  mm     G. Age:  33w 6d         10  %    CI:        75.39   %    70 - 86                                                          FL/HC:      21.4   %    20.1 - 22.1  HC:      306.8  mm     G. Age:  34w 1d        2.6  %    HC/AC:      1.04        0.93 - 1.11  AC:      294.3  mm     G. Age:  33w 3d          6  %    FL/BPD:     78.1   %    71 - 87  FL:       65.6  mm     G. Age:  33w 6d          7  %    FL/AC:      22.3   %    20 - 24  HUM:      58.4  mm     G. Age:  33w 6d         31  %  LV:        4.2  mm  Est. FW:    2243  gm    4 lb 15 oz       7  % ---------------------------------------------------------------------- OB History  Gravidity:    2         Term:   1  Living:       1 ----------------------------------------------------------------------  Gestational Age  LMP:           35w 5d        Date:  10/29/19                 EDD:   08/04/20  U/S Today:     33w 6d                                        EDD:   08/17/20  Best:          35w 5d     Det. By:  LMP  (10/29/19)          EDD:   08/04/20 ---------------------------------------------------------------------- Anatomy  Cranium:  Appears normal         LVOT:                   Not well visualized  Cavum:                 Appears normal         Aortic Arch:            Appears normal  Ventricles:            Appears normal         Ductal Arch:            Appears normal  Choroid Plexus:        Appears normal         Diaphragm:              Appears normal  Cerebellum:            Appears normal         Stomach:                Appears normal, left                                                                        sided  Posterior Fossa:       Appears normal         Abdomen:                Appears normal  Nuchal Fold:           Not applicable (>20    Abdominal Wall:         Not well visualized                         wks GA)  Face:                  Not well visualized    Cord Vessels:           Appears normal (3                                                                        vessel cord)  Lips:                  Not well visualized    Kidneys:                Appear normal  Palate:                Not well visualized    Bladder:                Appears normal  Thoracic:              Appears normal         Spine:                  Appears normal  Heart:                 Appears normal         Upper Extremities:      Not well visualized                         (4CH, axis, and                         situs)  RVOT:                  Not well visualized    Lower Extremities:      Not well visualized  Other:  Technically difficult due to advanced gestational age. ---------------------------------------------------------------------- Doppler - Fetal Vessels  Umbilical Artery   S/D     %tile      RI    %tile                              ADFV    RDFV    2.3       42    0.57       49                                No      No ---------------------------------------------------------------------- Cervix Uterus Adnexa  Cervix  Not visualized (advanced GA >24wks)  Uterus  No abnormality visualized.  Right Ovary  Not visualized.  Left Ovary  Not visualized.  Cul De Sac  No free fluid seen.  Adnexa  No abnormality visualized. ---------------------------------------------------------------------- Impression  Patient is here for ultrasound because of uterine size dates  discrepancy detected at your office.  Her prenatal course has been otherwise uneventful.  On  serum screening, the risks of fetal aneuploidies were not  increased.  MSAFP screening showed low risk for open  neural tube defects.  Patient reports she does not have gestational diabetes.  Blood pressure today at her office is 101/62 mmHg.  Obstetric history significant for a term vaginal delivery in 2020  of a female infant weighing 7 pounds 1 ounce at birth.  She reports no chronic medical conditions.  On today's ultrasound, the estimated fetal weight is at the 7th  percentile.  Abdominal circumference measurement is at the  6 percentile.  Amniotic fluid is normal and good fetal activity  seen.  Fetal anatomical survey appears normal but limited by  advanced gestational age.  Antenatal testing is reassuring.  Umbilical artery Doppler showed normal forward diastolic  flow.  NST is reactive.  BPP 10/10.  I explained the finding of fetal growth restriction that is difficult  to differentiate from a constitutionally small fetus.  I reassured  her of normal antenatal testing.  I explained ultrasound  protocol in monitoring fetal growth restriction. ---------------------------------------------------------------------- Recommendations  -Weekly BPP and UA Doppler studies still delivery.  -Fetal growth assessment in 3 weeks.  -Delivery at [redacted] weeks gestation provided antenatal  testing  remains reassuring. ----------------------------------------------------------------------                  Noralee Space, MD Electronically Signed Final Report   07/05/2020 03:18 pm ----------------------------------------------------------------------  [place] MFM OB DETAIL +14 WK  Result Date: 07/05/2020 ----------------------------------------------------------------------  OBSTETRICS REPORT                       (  Signed Final 07/05/2020 03:18 pm) ---------------------------------------------------------------------- Patient Info  ID #:       [id number]                          D.O.B.:  03-02-1995 (24 yrs)  Name:       Kari Schmidt                         Visit Date: 07/05/2020 01:21 pm ---------------------------------------------------------------------- Performed By  Attending:        Noralee Space MD        Ref. Address:     [facility]                                                             [facility]                                                             Healthcare  Performed By:     Fayne Norrie BS,      Location:         [facility]                    RDMS, RVT                                [facility]  Referred By:      Bernerd Limbo CNM ---------------------------------------------------------------------- Orders  #  Description                           Code        Ordered By  1  [place] MFM OB DETAIL +14 WK               76811.01    Jalyne Brodzinski  2  [place] MFM UA CORD DOPPLER                76820.02    Wilson Sample  3  [place] MFM FETAL BPP  11914.7     Annie Saephan     W/NONSTRESS ----------------------------------------------------------------------  #  Order #                     Accession #                Episode #  1  [id number]                   [id number]                 [id number]  2  [id number]                    [id number]                 [id number]  3  [id number]                   [id number]                 [id number] ---------------------------------------------------------------------- Indications  Uterine size-date discrepancy, third trimester O26.843  Late to prenatal care, third trimester         O09.33  Maternal thalassemia complicating              O99.013  pregnancy in third trimester (silent carrier)  Encounter for antenatal screening for other    Z36.8A  genetic defects  CF negative  Negative Quad Screen  [redacted] weeks gestation of pregnancy                Z3A.35 ---------------------------------------------------------------------- Fetal Evaluation  Num Of Fetuses:         1  Fetal Heart Rate(bpm):  152  Cardiac Activity:       Observed  Presentation:           Cephalic  Placenta:               Posterior  P. Cord Insertion:      Visualized, central  Amniotic Fluid  AFI FV:      Within normal limits  AFI Sum(cm)     %Tile       Largest Pocket(cm)  12.21           38          4.32  RUQ(cm)       RLQ(cm)       LUQ(cm)        LLQ(cm)  1.9           2.51          3.48           4.32 ---------------------------------------------------------------------- Biophysical Evaluation  Amniotic F.V:   Within normal limits       F. Tone:        Observed  F. Movement:    Observed                   N.S.T:          Reactive  F. Breathing:   Observed                   Score:          10/10 ---------------------------------------------------------------------- Biometry  BPD:        84  mm     G. Age:  33w 6d         10  %    CI:        75.39   %  70 - 86                                                          FL/HC:      21.4   %    20.1 - 22.1  HC:      306.8  mm     G. Age:  34w 1d        2.6  %    HC/AC:      1.04        0.93 - 1.11  AC:      294.3  mm     G. Age:  33w 3d          6  %    FL/BPD:     78.1   %    71 - 87  FL:       65.6  mm     G. Age:  33w 6d          7  %    FL/AC:      22.3   %    20 - 24  HUM:      58.4  mm      G. Age:  33w 6d         31  %  LV:        4.2  mm  Est. FW:    2243  gm    4 lb 15 oz       7  % ---------------------------------------------------------------------- OB History  Gravidity:    2         Term:   1  Living:       1 ---------------------------------------------------------------------- Gestational Age  LMP:           35w 5d        Date:  10/29/19                 EDD:   08/04/20  U/S Today:     33w 6d                                        EDD:   08/17/20  Best:          35w 5d     Det. By:  LMP  (10/29/19)          EDD:   08/04/20 ---------------------------------------------------------------------- Anatomy  Cranium:               Appears normal         LVOT:                   Not well visualized  Cavum:                 Appears normal         Aortic Arch:            Appears normal  Ventricles:            Appears normal         Ductal Arch:            Appears normal  Choroid Plexus:        Appears  normal         Diaphragm:              Appears normal  Cerebellum:            Appears normal         Stomach:                Appears normal, left                                                                        sided  Posterior Fossa:       Appears normal         Abdomen:                Appears normal  Nuchal Fold:           Not applicable (>20    Abdominal Wall:         Not well visualized                         wks GA)  Face:                  Not well visualized    Cord Vessels:           Appears normal (3                                                                        vessel cord)  Lips:                  Not well visualized    Kidneys:                Appear normal  Palate:                Not well visualized    Bladder:                Appears normal  Thoracic:              Appears normal         Spine:                  Appears normal  Heart:                 Appears normal         Upper Extremities:      Not well visualized                         (4CH, axis, and                         situs)   RVOT:                  Not well visualized    Lower Extremities:      Not  well visualized  Other:  Technically difficult due to advanced gestational age. ---------------------------------------------------------------------- Doppler - Fetal Vessels  Umbilical Artery   S/D     %tile      RI    %tile                             ADFV    RDFV    2.3       42    0.57       49                                No      No ---------------------------------------------------------------------- Cervix Uterus Adnexa  Cervix  Not visualized (advanced GA >24wks)  Uterus  No abnormality visualized.  Right Ovary  Not visualized.  Left Ovary  Not visualized.  Cul De Sac  No free fluid seen.  Adnexa  No abnormality visualized. ---------------------------------------------------------------------- Impression  Patient is here for ultrasound because of uterine size dates  discrepancy detected at your office.  Her prenatal course has been otherwise uneventful.  On  serum screening, the risks of fetal aneuploidies were not  increased.  MSAFP screening showed low risk for open  neural tube defects.  Patient reports she does not have gestational diabetes.  Blood pressure today at her office is 101/62 mmHg.  Obstetric history significant for a term vaginal delivery in 2020  of a female infant weighing 7 pounds 1 ounce at birth.  She reports no chronic medical conditions.  On today's ultrasound, the estimated fetal weight is at the 7th  percentile.  Abdominal circumference measurement is at the  6 percentile.  Amniotic fluid is normal and good fetal activity  seen.  Fetal anatomical survey appears normal but limited by  advanced gestational age.  Antenatal testing is reassuring.  Umbilical artery Doppler showed normal forward diastolic  flow.  NST is reactive.  BPP 10/10.  I explained the finding of fetal growth restriction that is difficult  to differentiate from a constitutionally small fetus.  I reassured  her of normal antenatal testing.  I  explained ultrasound  protocol in monitoring fetal growth restriction. ---------------------------------------------------------------------- Recommendations  -Weekly BPP and UA Doppler studies still delivery.  -Fetal growth assessment in 3 weeks.  -Delivery at [redacted] weeks gestation provided antenatal testing  remains reassuring. ----------------------------------------------------------------------                  Noralee Space, MD Electronically Signed Final Report   07/05/2020 03:18 pm ----------------------------------------------------------------------  [place] MFM UA CORD DOPPLER  Result Date: 07/05/2020 ----------------------------------------------------------------------  OBSTETRICS REPORT                       (Signed Final 07/05/2020 03:18 pm) ---------------------------------------------------------------------- Patient Info  ID #:       [id number]                          D.O.B.:  [birth year] (24 yrs)  Name:       Kari Schmidt                         Visit Date: 07/05/2020 01:21 pm ---------------------------------------------------------------------- Performed By  Attending:        Noralee Space MD        Ref. Address:  [facility]                                                             [facility]                                                             Healthcare  Performed By:     Fayne Norrie BS,      Location:         [facility]                    RDMS, RVT                                [facility]  Referred By:      Bernerd Limbo CNM ---------------------------------------------------------------------- Orders  #  Description                           Code        Ordered By  1  [place] MFM OB DETAIL +14 WK               76811.01    Tavone Caesar  2  [place] MFM UA CORD DOPPLER                76820.02    Jalena Vanderlinden  3  [place] MFM FETAL BPP                       16109.6     Sharnette Kitamura     W/NONSTRESS ----------------------------------------------------------------------  #  Order #                     Accession #                Episode #  1  [id number]                   [id number]                 [id number]  2  [id number]  [id number]                 [id number]  3  [id number]                   [id number]                 [id number] ---------------------------------------------------------------------- Indications  Uterine size-date discrepancy, third trimester O26.843  Late to prenatal care, third trimester         O09.33  Maternal thalassemia complicating              O99.013  pregnancy in third trimester (silent carrier)  Encounter for antenatal screening for other    Z36.8A  genetic defects  CF negative  Negative Quad Screen  [redacted] weeks gestation of pregnancy                Z3A.35 ---------------------------------------------------------------------- Fetal Evaluation  Num Of Fetuses:         1  Fetal Heart Rate(bpm):  152  Cardiac Activity:       Observed  Presentation:           Cephalic  Placenta:               Posterior  P. Cord Insertion:      Visualized, central  Amniotic Fluid  AFI FV:      Within normal limits  AFI Sum(cm)     %Tile       Largest Pocket(cm)  12.21           38          4.32  RUQ(cm)       RLQ(cm)       LUQ(cm)        LLQ(cm)  1.9           2.51          3.48           4.32 ---------------------------------------------------------------------- Biophysical Evaluation  Amniotic F.V:   Within normal limits       F. Tone:        Observed  F. Movement:    Observed                   N.S.T:          Reactive  F. Breathing:   Observed                   Score:          10/10 ---------------------------------------------------------------------- Biometry  BPD:        84  mm     G. Age:  33w 6d         10  %    CI:        75.39   %    70 - 86                                                          FL/HC:      21.4   %    20.1 -  22.1  HC:      306.8  mm     G. Age:  34w 1d        2.6  %    HC/AC:  1.04        0.93 - 1.11  AC:      294.3  mm     G. Age:  33w 3d          6  %    FL/BPD:     78.1   %    71 - 87  FL:       65.6  mm     G. Age:  33w 6d          7  %    FL/AC:      22.3   %    20 - 24  HUM:      58.4  mm     G. Age:  33w 6d         31  %  LV:        4.2  mm  Est. FW:    2243  gm    4 lb 15 oz       7  % ---------------------------------------------------------------------- OB History  Gravidity:    2         Term:   1  Living:       1 ---------------------------------------------------------------------- Gestational Age  LMP:           35w 5d        Date:  10/29/19                 EDD:   08/04/20  U/S Today:     33w 6d                                        EDD:   08/17/20  Best:          35w 5d     Det. By:  LMP  (10/29/19)          EDD:   08/04/20 ---------------------------------------------------------------------- Anatomy  Cranium:               Appears normal         LVOT:                   Not well visualized  Cavum:                 Appears normal         Aortic Arch:            Appears normal  Ventricles:            Appears normal         Ductal Arch:            Appears normal  Choroid Plexus:        Appears normal         Diaphragm:              Appears normal  Cerebellum:            Appears normal         Stomach:                Appears normal, left  sided  Posterior Fossa:       Appears normal         Abdomen:                Appears normal  Nuchal Fold:           Not applicable (>20    Abdominal Wall:         Not well visualized                         wks GA)  Face:                  Not well visualized    Cord Vessels:           Appears normal (3                                                                        vessel cord)  Lips:                  Not well visualized    Kidneys:                Appear normal  Palate:                Not well  visualized    Bladder:                Appears normal  Thoracic:              Appears normal         Spine:                  Appears normal  Heart:                 Appears normal         Upper Extremities:      Not well visualized                         (4CH, axis, and                         situs)  RVOT:                  Not well visualized    Lower Extremities:      Not well visualized  Other:  Technically difficult due to advanced gestational age. ---------------------------------------------------------------------- Doppler - Fetal Vessels  Umbilical Artery   S/D     %tile      RI    %tile                             ADFV    RDFV    2.3       42    0.57       49                                No      No ---------------------------------------------------------------------- Cervix Uterus Adnexa  Cervix  Not visualized (advanced GA >24wks)  Uterus  No abnormality visualized.  Right Ovary  Not visualized.  Left Ovary  Not visualized.  Cul De Sac  No free fluid seen.  Adnexa  No abnormality visualized. ---------------------------------------------------------------------- Impression  Patient is here for ultrasound because of uterine size dates  discrepancy detected at your office.  Her prenatal course has been otherwise uneventful.  On  serum screening, the risks of fetal aneuploidies were not  increased.  MSAFP screening showed low risk for open  neural tube defects.  Patient reports she does not have gestational diabetes.  Blood pressure today at her office is 101/62 mmHg.  Obstetric history significant for a term vaginal delivery in 2020  of a female infant weighing 7 pounds 1 ounce at birth.  She reports no chronic medical conditions.  On today's ultrasound, the estimated fetal weight is at the 7th  percentile.  Abdominal circumference measurement is at the  6 percentile.  Amniotic fluid is normal and good fetal activity  seen.  Fetal anatomical survey appears normal but limited by  advanced gestational age.   Antenatal testing is reassuring.  Umbilical artery Doppler showed normal forward diastolic  flow.  NST is reactive.  BPP 10/10.  I explained the finding of fetal growth restriction that is difficult  to differentiate from a constitutionally small fetus.  I reassured  her of normal antenatal testing.  I explained ultrasound  protocol in monitoring fetal growth restriction. ---------------------------------------------------------------------- Recommendations  -Weekly BPP and UA Doppler studies still delivery.  -Fetal growth assessment in 3 weeks.  -Delivery at [redacted] weeks gestation provided antenatal testing  remains reassuring. ----------------------------------------------------------------------                  Noralee Space, MD Electronically Signed Final Report   07/05/2020 03:18 pm ----------------------------------------------------------------------  MAU Course: Orders Placed This Encounter  Procedures  . Urinalysis, Routine w reflex microscopic Urine, Clean Catch  . Discharge patient   Meds ordered this encounter  Medications  . terbutaline (BRETHINE) injection 0.25 mg  . simethicone (MYLICON) chewable tablet 80 mg  . dicyclomine (BENTYL) capsule 10 mg   MDM: Initially describing pain as contraction like, terbutaline given (instead of procardia due to low normal BP) with no relief of pain. At reassessment, pt's friend had shown up and noted that all this began after she ate at [employer] [employer] this afternoon. Pt ate, started feeling ill, threw up once and then started having the abdominal pain which she now localized to her upper abdomen with some in her lower abdomen.  Simethicone and bentyl given with eventual complete relief of symptoms. UI remained but pt not feeling any more GI pain, no episodes of emesis/diarrhea while in MAU.  Assessment: 1. Abdominal cramping affecting pregnancy   2. NST (non-stress test) reactive   3. [redacted] weeks gestation of pregnancy    Plan: Discharge home in stable  condition with preterm labor precautions.     Follow-up Information    [facility] for [facility]. Go to.   Specialty: Obstetrics and Gynecology Why: as scheduled for ongoing prenatal care Contact information: [address] [place] [address] [phone]              Allergies as of 07/06/2020   No Known Allergies     Medication List    TAKE these medications   WesTab Plus 27-1 MG Tabs Take 1 tablet by mouth daily.      Edd Arbour,  CNM, MSN, IBCLC Certified Nurse Midwife, [facility]

## 2020-07-12 ENCOUNTER — Other Ambulatory Visit (HOSPITAL_COMMUNITY)
Admission: RE | Admit: 2020-07-12 | Discharge: 2020-07-12 | Disposition: A | Discharge status: Home / Self Care | Payer: Medicaid Other | Source: Ambulatory Visit | Attending: Obstetrics and Gynecology | Admitting: Obstetrics and Gynecology

## 2020-07-12 ENCOUNTER — Other Ambulatory Visit: Payer: Self-pay

## 2020-07-12 ENCOUNTER — Ambulatory Visit (INDEPENDENT_AMBULATORY_CARE_PROVIDER_SITE_OTHER): Payer: Medicaid Other | Admitting: Obstetrics and Gynecology

## 2020-07-12 DIAGNOSIS — Z3480 Encounter for supervision of other normal pregnancy, unspecified trimester: Secondary | ICD-10-CM | POA: Insufficient documentation

## 2020-07-12 DIAGNOSIS — O365990 Maternal care for other known or suspected poor fetal growth, unspecified trimester, not applicable or unspecified: Secondary | ICD-10-CM | POA: Insufficient documentation

## 2020-07-12 NOTE — Progress Notes (Signed)
   PRENATAL VISIT NOTE  Subjective:  Kari Schmidt is a 25 y.o. G2P1001 at [redacted]w[redacted]d being seen today for ongoing prenatal care.  She is currently monitored for the following issues for this low-risk pregnancy and has Supervision of other normal pregnancy, antepartum and Alpha thalassemia silent carrier on their problem list.  Patient reports no complaints.  Contractions: Irritability. Vag. Bleeding: None.  Movement: Present. Denies leaking of fluid.   The following portions of the patient's history were reviewed and updated as appropriate: allergies, current medications, past family history, past medical history, past social history, past surgical history and problem list.   Objective:   Vitals:   07/12/20 1605  BP: 101/73  Pulse: 87  Weight: 123 lb 12.8 oz (56.2 kg)    Fetal Status: Fetal Heart Rate (bpm): 144   Movement: Present     General:  Alert, oriented and cooperative. Patient is in no acute distress.  Skin: Skin is warm and dry. No rash noted.   Cardiovascular: Normal heart rate noted  Respiratory: Normal respiratory effort, no problems with respiration noted  Abdomen: Soft, gravid, appropriate for gestational age.  Pain/Pressure: Absent     Pelvic: Cervical exam deferred        Extremities: Normal range of motion.  Edema: None  Mental Status: Normal mood and affect. Normal behavior. Normal judgment and thought content.   Assessment and Plan:  Pregnancy: G2P1001 at [redacted]w[redacted]d  1. Supervision of other normal pregnancy, antepartum  - Culture, beta strep (group b only) - Cervicovaginal ancillary only  2. Small for gestational age  MFM [place] tomorrow. Per MFM delivery at 39 weeks unless [place] and dopplers are abnormal then delivery sooner. If She is still pregnancy next visit @ 38 weeks will schedule induction for 39 weeks per MFM.   Preterm labor symptoms and general obstetric precautions including but not limited to vaginal bleeding, contractions, leaking of fluid and fetal movement were  reviewed in detail with the patient. Please refer to After Visit Summary for other counseling recommendations.   No follow-ups on file.  Future Appointments  Date Time Provider Department Center  07/13/2020  7:45 AM [facility] NURSE [facility]  07/13/2020  8:00 AM [facility] US1 [facility]  07/19/2020  7:30 AM [facility] NURSE [facility]  07/19/2020  7:45 AM [facility] US5 [facility]    Venia Carbon, NP

## 2020-07-13 ENCOUNTER — Ambulatory Visit: Payer: Medicaid Other | Admitting: *Deleted

## 2020-07-13 ENCOUNTER — Ambulatory Visit: Payer: Medicaid Other | Attending: Obstetrics and Gynecology

## 2020-07-13 DIAGNOSIS — O26849 Uterine size-date discrepancy, unspecified trimester: Secondary | ICD-10-CM | POA: Insufficient documentation

## 2020-07-13 DIAGNOSIS — Z3480 Encounter for supervision of other normal pregnancy, unspecified trimester: Secondary | ICD-10-CM | POA: Insufficient documentation

## 2020-07-13 LAB — CERVICOVAGINAL ANCILLARY ONLY
Chlamydia: NEGATIVE
Comment: NEGATIVE
Comment: NORMAL
Neisseria Gonorrhea: NEGATIVE

## 2020-07-16 LAB — CULTURE, BETA STREP (GROUP B ONLY): Strep Gp B Culture: NEGATIVE

## 2020-07-19 ENCOUNTER — Other Ambulatory Visit: Payer: Self-pay

## 2020-07-19 ENCOUNTER — Ambulatory Visit: Payer: Medicaid Other | Attending: Obstetrics and Gynecology

## 2020-07-19 ENCOUNTER — Ambulatory Visit: Payer: Medicaid Other | Admitting: *Deleted

## 2020-07-19 ENCOUNTER — Encounter: Payer: Self-pay | Admitting: *Deleted

## 2020-07-19 DIAGNOSIS — Z3480 Encounter for supervision of other normal pregnancy, unspecified trimester: Secondary | ICD-10-CM

## 2020-07-19 DIAGNOSIS — O26849 Uterine size-date discrepancy, unspecified trimester: Secondary | ICD-10-CM | POA: Diagnosis present

## 2020-07-20 ENCOUNTER — Ambulatory Visit (INDEPENDENT_AMBULATORY_CARE_PROVIDER_SITE_OTHER): Payer: Medicaid Other | Admitting: Family Medicine

## 2020-07-20 ENCOUNTER — Encounter: Payer: Self-pay | Admitting: Family Medicine

## 2020-07-20 ENCOUNTER — Other Ambulatory Visit: Payer: Self-pay | Admitting: Advanced Practice Midwife

## 2020-07-20 VITALS — BP 113/79 | HR 76 | Wt 126.6 lb

## 2020-07-20 DIAGNOSIS — O365930 Maternal care for other known or suspected poor fetal growth, third trimester, not applicable or unspecified: Secondary | ICD-10-CM

## 2020-07-20 DIAGNOSIS — Z3480 Encounter for supervision of other normal pregnancy, unspecified trimester: Secondary | ICD-10-CM

## 2020-07-20 NOTE — Patient Instructions (Signed)
 Contraception Choices Contraception, also called birth control, refers to methods or devices that prevent pregnancy. Hormonal methods Contraceptive implant A contraceptive implant is a thin, plastic tube that contains a hormone that prevents pregnancy. It is different from an intrauterine device (IUD). It is inserted into the upper part of the arm by a health care provider. Implants can be effective for up to 3 years. Progestin-only injections Progestin-only injections are injections of progestin, a synthetic form of the hormone progesterone. They are given every 3 months by a health care provider. Birth control pills Birth control pills are pills that contain hormones that prevent pregnancy. They must be taken once a day, preferably at the same time each day. A prescription is needed to use this method of contraception. Birth control patch The birth control patch contains hormones that prevent pregnancy. It is placed on the skin and must be changed once a week for three weeks and removed on the fourth week. A prescription is needed to use this method of contraception. Vaginal ring A vaginal ring contains hormones that prevent pregnancy. It is placed in the vagina for three weeks and removed on the fourth week. After that, the process is repeated with a new ring. A prescription is needed to use this method of contraception. Emergency contraceptive Emergency contraceptives prevent pregnancy after unprotected sex. They come in pill form and can be taken up to 5 days after sex. They work best the sooner they are taken after having sex. Most emergency contraceptives are available without a prescription. This method should not be used as your only form of birth control.   Barrier methods Female condom A female condom is a thin sheath that is worn over the penis during sex. Condoms keep sperm from going inside a woman's body. They can be used with a sperm-killing substance (spermicide) to increase their  effectiveness. They should be thrown away after one use. Female condom A female condom is a soft, loose-fitting sheath that is put into the vagina before sex. The condom keeps sperm from going inside a woman's body. They should be thrown away after one use. Diaphragm A diaphragm is a soft, dome-shaped barrier. It is inserted into the vagina before sex, along with a spermicide. The diaphragm blocks sperm from entering the uterus, and the spermicide kills sperm. A diaphragm should be left in the vagina for 6-8 hours after sex and removed within 24 hours. A diaphragm is prescribed and fitted by a health care provider. A diaphragm should be replaced every 1-2 years, after giving birth, after gaining more than 15 lb (6.8 kg), and after pelvic surgery. Cervical cap A cervical cap is a round, soft latex or plastic cup that fits over the cervix. It is inserted into the vagina before sex, along with spermicide. It blocks sperm from entering the uterus. The cap should be left in place for 6-8 hours after sex and removed within 48 hours. A cervical cap must be prescribed and fitted by a health care provider. It should be replaced every 2 years. Sponge A sponge is a soft, circular piece of polyurethane foam with spermicide in it. The sponge helps block sperm from entering the uterus, and the spermicide kills sperm. To use it, you make it wet and then insert it into the vagina. It should be inserted before sex, left in for at least 6 hours after sex, and removed and thrown away within 30 hours. Spermicides Spermicides are chemicals that kill or block sperm from entering the   cervix and uterus. They can come as a cream, jelly, suppository, foam, or tablet. A spermicide should be inserted into the vagina with an applicator at least 10-15 minutes before sex to allow time for it to work. The process must be repeated every time you have sex. Spermicides do not require a prescription.   Intrauterine  contraception Intrauterine device (IUD) An IUD is a T-shaped device that is put in a woman's uterus. There are two types:  Hormone IUD.This type contains progestin, a synthetic form of the hormone progesterone. This type can stay in place for 3-5 years.  Copper IUD.This type is wrapped in copper wire. It can stay in place for 10 years. Permanent methods of contraception Female tubal ligation In this method, a woman's fallopian tubes are sealed, tied, or blocked during surgery to prevent eggs from traveling to the uterus. Hysteroscopic sterilization In this method, a small, flexible insert is placed into each fallopian tube. The inserts cause scar tissue to form in the fallopian tubes and block them, so sperm cannot reach an egg. The procedure takes about 3 months to be effective. Another form of birth control must be used during those 3 months. Female sterilization This is a procedure to tie off the tubes that carry sperm (vasectomy). After the procedure, the man can still ejaculate fluid (semen). Another form of birth control must be used for 3 months after the procedure. Natural planning methods Natural family planning In this method, a couple does not have sex on days when the woman could become pregnant. Calendar method In this method, the woman keeps track of the length of each menstrual cycle, identifies the days when pregnancy can happen, and does not have sex on those days. Ovulation method In this method, a couple avoids sex during ovulation. Symptothermal method This method involves not having sex during ovulation. The woman typically checks for ovulation by watching changes in her temperature and in the consistency of cervical mucus. Post-ovulation method In this method, a couple waits to have sex until after ovulation. Where to find more information  Centers for Disease Control and Prevention: www.cdc.gov Summary  Contraception, also called birth control, refers to methods or  devices that prevent pregnancy.  Hormonal methods of contraception include implants, injections, pills, patches, vaginal rings, and emergency contraceptives.  Barrier methods of contraception can include female condoms, female condoms, diaphragms, cervical caps, sponges, and spermicides.  There are two types of IUDs (intrauterine devices). An IUD can be put in a woman's uterus to prevent pregnancy for 3-5 years.  Permanent sterilization can be done through a procedure for males and females. Natural family planning methods involve nothaving sex on days when the woman could become pregnant. This information is not intended to replace advice given to you by your health care provider. Make sure you discuss any questions you have with your health care provider. Document Revised: 07/21/2019 Document Reviewed: 07/21/2019 Elsevier Patient Education  2021 [employer] Inc.   Breastfeeding  Choosing to breastfeed is one of the best decisions you can make for yourself and your baby. A change in hormones during pregnancy causes your breasts to make breast milk in your milk-producing glands. Hormones prevent breast milk from being released before your baby is born. They also prompt milk flow after birth. Once breastfeeding has begun, thoughts of your baby, as well as his or her sucking or crying, can stimulate the release of milk from your milk-producing glands. Benefits of breastfeeding Research shows that breastfeeding offers many health benefits   for infants and mothers. It also offers a cost-free and convenient way to feed your baby. For your baby  Your first milk (colostrum) helps your baby's digestive system to function better.  Special cells in your milk (antibodies) help your baby to fight off infections.  Breastfed babies are less likely to develop asthma, allergies, obesity, or type 2 diabetes. They are also at lower risk for sudden infant death syndrome (SIDS).  Nutrients in breast milk are better  able to meet your baby's needs compared to infant formula.  Breast milk improves your baby's brain development. For you  Breastfeeding helps to create a very special bond between you and your baby.  Breastfeeding is convenient. Breast milk costs nothing and is always available at the correct temperature.  Breastfeeding helps to burn calories. It helps you to lose the weight that you gained during pregnancy.  Breastfeeding makes your uterus return faster to its size before pregnancy. It also slows bleeding (lochia) after you give birth.  Breastfeeding helps to lower your risk of developing type 2 diabetes, osteoporosis, rheumatoid arthritis, cardiovascular disease, and breast, ovarian, uterine, and endometrial cancer later in life. Breastfeeding basics Starting breastfeeding  Find a comfortable place to sit or lie down, with your neck and back well-supported.  Place a pillow or a rolled-up blanket under your baby to bring him or her to the level of your breast (if you are seated). Nursing pillows are specially designed to help support your arms and your baby while you breastfeed.  Make sure that your baby's tummy (abdomen) is facing your abdomen.  Gently massage your breast. With your fingertips, massage from the outer edges of your breast inward toward the nipple. This encourages milk flow. If your milk flows slowly, you may need to continue this action during the feeding.  Support your breast with 4 fingers underneath and your thumb above your nipple (make the letter "C" with your hand). Make sure your fingers are well away from your nipple and your baby's mouth.  Stroke your baby's lips gently with your finger or nipple.  When your baby's mouth is open wide enough, quickly bring your baby to your breast, placing your entire nipple and as much of the areola as possible into your baby's mouth. The areola is the colored area around your nipple. ? More areola should be visible above your  baby's upper lip than below the lower lip. ? Your baby's lips should be opened and extended outward (flanged) to ensure an adequate, comfortable latch. ? Your baby's tongue should be between his or her lower gum and your breast.  Make sure that your baby's mouth is correctly positioned around your nipple (latched). Your baby's lips should create a seal on your breast and be turned out (everted).  It is common for your baby to suck about 2-3 minutes in order to start the flow of breast milk. Latching Teaching your baby how to latch onto your breast properly is very important. An improper latch can cause nipple pain, decreased milk supply, and poor weight gain in your baby. Also, if your baby is not latched onto your nipple properly, he or she may swallow some air during feeding. This can make your baby fussy. Burping your baby when you switch breasts during the feeding can help to get rid of the air. However, teaching your baby to latch on properly is still the best way to prevent fussiness from swallowing air while breastfeeding. Signs that your baby has successfully latched onto   your nipple  Silent tugging or silent sucking, without causing you pain. Infant's lips should be extended outward (flanged).  Swallowing heard between every 3-4 sucks once your milk has started to flow (after your let-down milk reflex occurs).  Muscle movement above and in front of his or her ears while sucking. Signs that your baby has not successfully latched onto your nipple  Sucking sounds or smacking sounds from your baby while breastfeeding.  Nipple pain. If you think your baby has not latched on correctly, slip your finger into the corner of your baby's mouth to break the suction and place it between your baby's gums. Attempt to start breastfeeding again. Signs of successful breastfeeding Signs from your baby  Your baby will gradually decrease the number of sucks or will completely stop sucking.  Your baby  will fall asleep.  Your baby's body will relax.  Your baby will retain a small amount of milk in his or her mouth.  Your baby will let go of your breast by himself or herself. Signs from you  Breasts that have increased in firmness, weight, and size 1-3 hours after feeding.  Breasts that are softer immediately after breastfeeding.  Increased milk volume, as well as a change in milk consistency and color by the fifth day of breastfeeding.  Nipples that are not sore, cracked, or bleeding. Signs that your baby is getting enough milk  Wetting at least 1-2 diapers during the first 24 hours after birth.  Wetting at least 5-6 diapers every 24 hours for the first week after birth. The urine should be clear or pale yellow by the age of 5 days.  Wetting 6-8 diapers every 24 hours as your baby continues to grow and develop.  At least 3 stools in a 24-hour period by the age of 5 days. The stool should be soft and yellow.  At least 3 stools in a 24-hour period by the age of 7 days. The stool should be seedy and yellow.  No loss of weight greater than 10% of birth weight during the first 3 days of life.  Average weight gain of 4-7 oz (113-198 g) per week after the age of 4 days.  Consistent daily weight gain by the age of 5 days, without weight loss after the age of 2 weeks. After a feeding, your baby may spit up a small amount of milk. This is normal. Breastfeeding frequency and duration Frequent feeding will help you make more milk and can prevent sore nipples and extremely full breasts (breast engorgement). Breastfeed when you feel the need to reduce the fullness of your breasts or when your baby shows signs of hunger. This is called "breastfeeding on demand." Signs that your baby is hungry include:  Increased alertness, activity, or restlessness.  Movement of the head from side to side.  Opening of the mouth when the corner of the mouth or cheek is stroked (rooting).  Increased  sucking sounds, smacking lips, cooing, sighing, or squeaking.  Hand-to-mouth movements and sucking on fingers or hands.  Fussing or crying. Avoid introducing a pacifier to your baby in the first 4-6 weeks after your baby is born. After this time, you may choose to use a pacifier. Research has shown that pacifier use during the first year of a baby's life decreases the risk of sudden infant death syndrome (SIDS). Allow your baby to feed on each breast as long as he or she wants. When your baby unlatches or falls asleep while feeding from the   first breast, offer the second breast. Because newborns are often sleepy in the first few weeks of life, you may need to awaken your baby to get him or her to feed. Breastfeeding times will vary from baby to baby. However, the following rules can serve as a guide to help you make sure that your baby is properly fed:  Newborns (babies 4 weeks of age or younger) may breastfeed every 1-3 hours.  Newborns should not go without breastfeeding for longer than 3 hours during the day or 5 hours during the night.  You should breastfeed your baby a minimum of 8 times in a 24-hour period. Breast milk pumping Pumping and storing breast milk allows you to make sure that your baby is exclusively fed your breast milk, even at times when you are unable to breastfeed. This is especially important if you go back to work while you are still breastfeeding, or if you are not able to be present during feedings. Your lactation consultant can help you find a method of pumping that works best for you and give you guidelines about how long it is safe to store breast milk.      Caring for your breasts while you breastfeed Nipples can become dry, cracked, and sore while breastfeeding. The following recommendations can help keep your breasts moisturized and healthy:  Avoid using soap on your nipples.  Wear a supportive bra designed especially for nursing. Avoid wearing underwire-style  bras or extremely tight bras (sports bras).  Air-dry your nipples for 3-4 minutes after each feeding.  Use only cotton bra pads to absorb leaked breast milk. Leaking of breast milk between feedings is normal.  Use lanolin on your nipples after breastfeeding. Lanolin helps to maintain your skin's normal moisture barrier. Pure lanolin is not harmful (not toxic) to your baby. You may also hand express a few drops of breast milk and gently massage that milk into your nipples and allow the milk to air-dry. In the first few weeks after giving birth, some women experience breast engorgement. Engorgement can make your breasts feel heavy, warm, and tender to the touch. Engorgement peaks within 3-5 days after you give birth. The following recommendations can help to ease engorgement:  Completely empty your breasts while breastfeeding or pumping. You may want to start by applying warm, moist heat (in the shower or with warm, water-soaked hand towels) just before feeding or pumping. This increases circulation and helps the milk flow. If your baby does not completely empty your breasts while breastfeeding, pump any extra milk after he or she is finished.  Apply ice packs to your breasts immediately after breastfeeding or pumping, unless this is too uncomfortable for you. To do this: ? Put ice in a plastic bag. ? Place a towel between your skin and the bag. ? Leave the ice on for 20 minutes, 2-3 times a day.  Make sure that your baby is latched on and positioned properly while breastfeeding. If engorgement persists after 48 hours of following these recommendations, contact your health care provider or a lactation consultant. Overall health care recommendations while breastfeeding  Eat 3 healthy meals and 3 snacks every day. Well-nourished mothers who are breastfeeding need an additional 450-500 calories a day. You can meet this requirement by increasing the amount of a balanced diet that you eat.  Drink  enough water to keep your urine pale yellow or clear.  Rest often, relax, and continue to take your prenatal vitamins to prevent fatigue, stress, and low   vitamin and mineral levels in your body (nutrient deficiencies).  Do not use any products that contain nicotine or tobacco, such as cigarettes and e-cigarettes. Your baby may be harmed by chemicals from cigarettes that pass into breast milk and exposure to secondhand smoke. If you need help quitting, ask your health care provider.  Avoid alcohol.  Do not use illegal drugs or marijuana.  Talk with your health care provider before taking any medicines. These include over-the-counter and prescription medicines as well as vitamins and herbal supplements. Some medicines that may be harmful to your baby can pass through breast milk.  It is possible to become pregnant while breastfeeding. If birth control is desired, ask your health care provider about options that will be safe while breastfeeding your baby. Where to find more information: [employer]: www.llli.org Contact a health care provider if:  You feel like you want to stop breastfeeding or have become frustrated with breastfeeding.  Your nipples are cracked or bleeding.  Your breasts are red, tender, or warm.  You have: ? Painful breasts or nipples. ? A swollen area on either breast. ? A fever or chills. ? Nausea or vomiting. ? Drainage other than breast milk from your nipples.  Your breasts do not become full before feedings by the fifth day after you give birth.  You feel sad and depressed.  Your baby is: ? Too sleepy to eat well. ? Having trouble sleeping. ? More than 1 week old and wetting fewer than 6 diapers in a 24-hour period. ? Not gaining weight by 5 days of age.  Your baby has fewer than 3 stools in a 24-hour period.  Your baby's skin or the white parts of his or her eyes become yellow. Get help right away if:  Your baby is overly tired  (lethargic) and does not want to wake up and feed.  Your baby develops an unexplained fever. Summary  Breastfeeding offers many health benefits for infant and mothers.  Try to breastfeed your infant when he or she shows early signs of hunger.  Gently tickle or stroke your baby's lips with your finger or nipple to allow the baby to open his or her mouth. Bring the baby to your breast. Make sure that much of the areola is in your baby's mouth. Offer one side and burp the baby before you offer the other side.  Talk with your health care provider or lactation consultant if you have questions or you face problems as you breastfeed. This information is not intended to replace advice given to you by your health care provider. Make sure you discuss any questions you have with your health care provider. Document Revised: 05/10/2017 Document Reviewed: 03/17/2016 Elsevier Patient Education  2021 Elsevier Inc.  

## 2020-07-20 NOTE — Progress Notes (Signed)
   Subjective:  Kari Schmidt is a 25 y.o. G2P1001 at [redacted]w[redacted]d being seen today for ongoing prenatal care.  She is currently monitored for the following issues for this high-risk pregnancy and has Supervision of other normal pregnancy, antepartum; Alpha thalassemia silent carrier; and IUGR (intrauterine growth restriction) affecting care of mother on their problem list.  Patient reports no complaints.  Contractions: Not present. Vag. Bleeding: None.  Movement: Present. Denies leaking of fluid.   The following portions of the patient's history were reviewed and updated as appropriate: allergies, current medications, past family history, past medical history, past social history, past surgical history and problem list. Problem list updated.  Objective:   Vitals:   07/20/20 1003  BP: 113/79  Pulse: 76  Weight: 126 lb 9.6 oz (57.4 kg)    Fetal Status: Fetal Heart Rate (bpm): 138   Movement: Present     General:  Alert, oriented and cooperative. Patient is in no acute distress.  Skin: Skin is warm and dry. No rash noted.   Cardiovascular: Normal heart rate noted  Respiratory: Normal respiratory effort, no problems with respiration noted  Abdomen: Soft, gravid, appropriate for gestational age. Pain/Pressure: Absent     Pelvic: Vag. Bleeding: None     Cervical exam deferred        Extremities: Normal range of motion.  Edema: None  Mental Status: Normal mood and affect. Normal behavior. Normal judgment and thought content.   Urinalysis:      Assessment and Plan:  Pregnancy: G2P1001 at [redacted]w[redacted]d  1. Supervision of other normal pregnancy, antepartum BP and FHR normal  2. Poor fetal growth affecting management of mother in third trimester, single or unspecified fetus Reassuring fetal testing to date Per MFM plan IOL scheduled at [redacted]w[redacted]d (due to large amount of IOL at 39wk date) IOL orders placed, scheduling form faxed Declined cervical exam/outpatient foley  Term labor symptoms and general obstetric  precautions including but not limited to vaginal bleeding, contractions, leaking of fluid and fetal movement were reviewed in detail with the patient. Please refer to After Visit Summary for other counseling recommendations.  Return in 1 week (on 07/27/2020).   Venora Maples, MD

## 2020-07-27 ENCOUNTER — Telehealth (HOSPITAL_COMMUNITY): Payer: Self-pay | Admitting: *Deleted

## 2020-07-27 NOTE — Telephone Encounter (Signed)
Preadmission screen  

## 2020-07-28 ENCOUNTER — Other Ambulatory Visit: Payer: Self-pay | Admitting: Advanced Practice Midwife

## 2020-07-28 ENCOUNTER — Telehealth (HOSPITAL_COMMUNITY): Payer: Self-pay | Admitting: *Deleted

## 2020-07-28 ENCOUNTER — Other Ambulatory Visit (HOSPITAL_COMMUNITY)
Admission: RE | Admit: 2020-07-28 | Discharge: 2020-07-28 | Disposition: A | Discharge status: Home / Self Care | Payer: Medicaid Other | Source: Ambulatory Visit | Attending: Family Medicine | Admitting: Family Medicine

## 2020-07-28 DIAGNOSIS — Z01812 Encounter for preprocedural laboratory examination: Secondary | ICD-10-CM | POA: Insufficient documentation

## 2020-07-28 DIAGNOSIS — Z20822 Contact with and (suspected) exposure to covid-19: Secondary | ICD-10-CM | POA: Insufficient documentation

## 2020-07-28 LAB — SARS CORONAVIRUS 2 (TAT 6-24 HRS): SARS Coronavirus 2: NEGATIVE

## 2020-07-28 NOTE — Telephone Encounter (Signed)
Preadmission screen  

## 2020-07-29 ENCOUNTER — Encounter (HOSPITAL_COMMUNITY): Payer: Self-pay | Admitting: Family Medicine

## 2020-07-29 ENCOUNTER — Inpatient Hospital Stay (HOSPITAL_COMMUNITY): Payer: Medicaid Other | Admitting: Anesthesiology

## 2020-07-29 ENCOUNTER — Inpatient Hospital Stay (HOSPITAL_COMMUNITY)
Admission: AD | Admit: 2020-07-29 | Discharge: 2020-07-30 | DRG: 807 | Disposition: A | Discharge status: Home / Self Care | Payer: Medicaid Other | Attending: Family Medicine | Admitting: Family Medicine

## 2020-07-29 ENCOUNTER — Inpatient Hospital Stay (HOSPITAL_COMMUNITY): Payer: Medicaid Other

## 2020-07-29 ENCOUNTER — Other Ambulatory Visit: Payer: Self-pay

## 2020-07-29 DIAGNOSIS — Z3A39 39 weeks gestation of pregnancy: Secondary | ICD-10-CM | POA: Diagnosis not present

## 2020-07-29 DIAGNOSIS — Z3480 Encounter for supervision of other normal pregnancy, unspecified trimester: Principal | ICD-10-CM

## 2020-07-29 DIAGNOSIS — D563 Thalassemia minor: Secondary | ICD-10-CM | POA: Diagnosis present

## 2020-07-29 DIAGNOSIS — O365930 Maternal care for other known or suspected poor fetal growth, third trimester, not applicable or unspecified: Principal | ICD-10-CM | POA: Diagnosis present

## 2020-07-29 DIAGNOSIS — Z20822 Contact with and (suspected) exposure to covid-19: Secondary | ICD-10-CM | POA: Diagnosis present

## 2020-07-29 DIAGNOSIS — O365990 Maternal care for other known or suspected poor fetal growth, unspecified trimester, not applicable or unspecified: Secondary | ICD-10-CM | POA: Diagnosis present

## 2020-07-29 LAB — TYPE AND SCREEN
ABO/RH(D): O POS
Antibody Screen: NEGATIVE

## 2020-07-29 LAB — HEPATITIS B SURFACE ANTIGEN: Hepatitis B Surface Ag: NONREACTIVE

## 2020-07-29 LAB — CBC
HCT: 43.1 % (ref 36.0–46.0)
Hemoglobin: 14 g/dL (ref 12.0–15.0)
MCH: 27.9 pg (ref 26.0–34.0)
MCHC: 32.5 g/dL (ref 30.0–36.0)
MCV: 85.9 fL (ref 80.0–100.0)
Platelets: 191 10*3/uL (ref 150–400)
RBC: 5.02 MIL/uL (ref 3.87–5.11)
RDW: 13.5 % (ref 11.5–15.5)
WBC: 6.7 10*3/uL (ref 4.0–10.5)
nRBC: 0 % (ref 0.0–0.2)

## 2020-07-29 LAB — RPR: RPR Ser Ql: NONREACTIVE

## 2020-07-29 MED ORDER — PRENATAL MULTIVITAMIN CH
1.0000 | ORAL_TABLET | Freq: Every day | ORAL | Status: DC
  Administered 2020-07-30: 1 via ORAL
  Filled 2020-07-29: qty 1

## 2020-07-29 MED ORDER — DIBUCAINE (PERIANAL) 1 % EX OINT
1.0000 | TOPICAL_OINTMENT | CUTANEOUS | Status: DC | PRN
Start: 2020-07-29 — End: 2020-07-30

## 2020-07-29 MED ORDER — DIPHENHYDRAMINE HCL 25 MG PO CAPS: 25.0000 mg | ORAL_CAPSULE | Freq: Four times a day (QID) | ORAL | Status: DC | PRN

## 2020-07-29 MED ORDER — PHENYLEPHRINE 40 MCG/ML (10ML) SYRINGE FOR IV PUSH (FOR BLOOD PRESSURE SUPPORT): 80.0000 ug | PREFILLED_SYRINGE | INTRAVENOUS | Status: DC | PRN

## 2020-07-29 MED ORDER — ONDANSETRON HCL 4 MG/2ML IJ SOLN
4.0000 mg | INTRAMUSCULAR | Status: DC | PRN
Start: 2020-07-29 — End: 2020-07-30

## 2020-07-29 MED ORDER — FENTANYL CITRATE (PF) 100 MCG/2ML IJ SOLN: 50.0000 ug | INTRAMUSCULAR | Status: DC | PRN

## 2020-07-29 MED ORDER — COCONUT OIL OIL: 1.0000 "application " | TOPICAL_OIL | Status: DC | PRN

## 2020-07-29 MED ORDER — ACETAMINOPHEN 325 MG PO TABS
650.0000 mg | ORAL_TABLET | Freq: Four times a day (QID) | ORAL | Status: DC
  Administered 2020-07-29 – 2020-07-30 (×2): 650 mg via ORAL
  Filled 2020-07-29 (×3): qty 2

## 2020-07-29 MED ORDER — LIDOCAINE HCL (PF) 1 % IJ SOLN
INTRAMUSCULAR | Status: DC | PRN
  Administered 2020-07-29: 5 mL via EPIDURAL

## 2020-07-29 MED ORDER — EPHEDRINE 5 MG/ML INJ: 10.0000 mg | INTRAVENOUS | Status: DC | PRN

## 2020-07-29 MED ORDER — FENTANYL-BUPIVACAINE-NACL 0.5-0.125-0.9 MG/250ML-% EP SOLN
12.0000 mL/h | EPIDURAL | Status: DC | PRN
  Administered 2020-07-29: 12 mL/h via EPIDURAL
  Filled 2020-07-29: qty 250

## 2020-07-29 MED ORDER — DIPHENHYDRAMINE HCL 50 MG/ML IJ SOLN: 12.5000 mg | INTRAMUSCULAR | Status: DC | PRN

## 2020-07-29 MED ORDER — TETANUS-DIPHTH-ACELL PERTUSSIS 5-2.5-18.5 LF-MCG/0.5 IM SUSY: 0.5000 mL | PREFILLED_SYRINGE | Freq: Once | INTRAMUSCULAR | Status: DC

## 2020-07-29 MED ORDER — SOD CITRATE-CITRIC ACID 500-334 MG/5ML PO SOLN: 30.0000 mL | ORAL | Status: DC | PRN

## 2020-07-29 MED ORDER — SENNOSIDES-DOCUSATE SODIUM 8.6-50 MG PO TABS
2.0000 | ORAL_TABLET | Freq: Every day | ORAL | Status: DC
  Administered 2020-07-30: 2 via ORAL
  Filled 2020-07-29: qty 2

## 2020-07-29 MED ORDER — ACETAMINOPHEN 325 MG PO TABS: 650.0000 mg | ORAL_TABLET | ORAL | Status: DC | PRN

## 2020-07-29 MED ORDER — IBUPROFEN 600 MG PO TABS
600.0000 mg | ORAL_TABLET | Freq: Four times a day (QID) | ORAL | Status: DC
  Administered 2020-07-29 – 2020-07-30 (×3): 600 mg via ORAL
  Filled 2020-07-29 (×3): qty 1

## 2020-07-29 MED ORDER — WITCH HAZEL-GLYCERIN EX PADS
1.0000 | MEDICATED_PAD | CUTANEOUS | Status: DC | PRN
Start: 2020-07-29 — End: 2020-07-30

## 2020-07-29 MED ORDER — MEDROXYPROGESTERONE ACETATE 150 MG/ML IM SUSP: 150.0000 mg | Freq: Once | INTRAMUSCULAR | Status: DC

## 2020-07-29 MED ORDER — LACTATED RINGERS IV SOLN: 500.0000 mL | Freq: Once | INTRAVENOUS | Status: DC

## 2020-07-29 MED ORDER — ONDANSETRON HCL 4 MG PO TABS: 4.0000 mg | ORAL_TABLET | ORAL | Status: DC | PRN

## 2020-07-29 MED ORDER — OXYTOCIN-SODIUM CHLORIDE 30-0.9 UT/500ML-% IV SOLN
2.5000 [IU]/h | INTRAVENOUS | Status: DC
  Administered 2020-07-29: 2.5 [IU]/h via INTRAVENOUS

## 2020-07-29 MED ORDER — LACTATED RINGERS IV SOLN: INTRAVENOUS | Status: DC

## 2020-07-29 MED ORDER — OXYTOCIN-SODIUM CHLORIDE 30-0.9 UT/500ML-% IV SOLN
1.0000 m[IU]/min | INTRAVENOUS | Status: DC
  Administered 2020-07-29: 2 m[IU]/min via INTRAVENOUS
  Filled 2020-07-29: qty 500

## 2020-07-29 MED ORDER — ONDANSETRON HCL 4 MG/2ML IJ SOLN: 4.0000 mg | Freq: Four times a day (QID) | INTRAMUSCULAR | Status: DC | PRN

## 2020-07-29 MED ORDER — TERBUTALINE SULFATE 1 MG/ML IJ SOLN: 0.2500 mg | Freq: Once | INTRAMUSCULAR | Status: DC | PRN

## 2020-07-29 MED ORDER — LIDOCAINE HCL (PF) 1 % IJ SOLN: 30.0000 mL | INTRAMUSCULAR | Status: DC | PRN

## 2020-07-29 MED ORDER — OXYTOCIN BOLUS FROM INFUSION
333.0000 mL | Freq: Once | INTRAVENOUS | Status: AC
  Administered 2020-07-29: 333 mL via INTRAVENOUS

## 2020-07-29 MED ORDER — BENZOCAINE-MENTHOL 20-0.5 % EX AERO
1.0000 | INHALATION_SPRAY | CUTANEOUS | Status: DC | PRN
Start: 2020-07-29 — End: 2020-07-30

## 2020-07-29 MED ORDER — SIMETHICONE 80 MG PO CHEW: 80.0000 mg | CHEWABLE_TABLET | ORAL | Status: DC | PRN

## 2020-07-29 MED ORDER — LACTATED RINGERS IV SOLN: 500.0000 mL | INTRAVENOUS | Status: DC | PRN

## 2020-07-29 NOTE — Discharge Summary (Signed)
Postpartum Discharge Summary    Patient Name: Kari Schmidt DOB: 08-03-1995 MRN: [id number]  Date of admission: 07/29/2020 Delivery date:07/29/2020  Delivering provider: Renee Harder  Date of discharge: 07/30/2020  Admitting diagnosis: IUGR (intrauterine growth restriction) affecting care of mother [O36.5990] Intrauterine pregnancy: [redacted]w[redacted]d    Secondary diagnosis:  Principal Problem:   Vaginal delivery Active Problems:   Alpha thalassemia silent carrier   IUGR (intrauterine growth restriction) affecting care of mother  Additional problems: n/a    Discharge diagnosis: Term Pregnancy Delivered and IUGR                                              Post partum procedures:none Augmentation: Pitocin Complications: None  Hospital course: Induction of Labor With Vaginal Delivery   25y.o. yo G2P1001 at 353w1das admitted to the hospital 07/29/2020 for induction of labor. Indication for induction: IUGR. Pt progressed to complete cervical dilation s/p augmentation with pitocin. Patient had an uncomplicated labor course as follows: Membrane Rupture Time/Date: 4:30 PM ,07/29/2020   Delivery Method:Vaginal, Spontaneous  Episiotomy: None  Lacerations:  None  Details of delivery can be found in separate delivery note.  Patient had a routine postpartum course. Patient is discharged home 07/30/20.  Newborn Data: Birth date:07/29/2020  Birth time:4:42 PM  Gender:Female  Living status:Living  Apgars:8 ,9  [id number]   Magnesium Sulfate received: No BMZ received: No Rhophylac:N/A MMR:No, immune T-DaP:Given prenatally Flu: No Transfusion:No  Physical exam  Vitals:   07/29/20 2030 07/30/20 0030 07/30/20 0445 07/30/20 0845  BP: 102/70 102/70 102/72 115/66  Pulse: 86 86 76 72  Resp: '16 17 16 17  ' Temp: 97.7 F (36.5 C) 97.8 F (36.6 C) 98.3 F (36.8 C) 98.1 F (36.7 C)  TempSrc: Oral Oral Oral Oral  SpO2: 99% 99% 98%    General: alert, cooperative and no distress Lochia:  appropriate Uterine Fundus: firm Incision: N/A DVT Evaluation: No evidence of DVT seen on physical exam. No cords or calf tenderness. No significant calf/ankle edema. Labs: Lab Results  Component Value Date   WBC 6.7 07/29/2020   HGB 14.0 07/29/2020   HCT 43.1 07/29/2020   MCV 85.9 07/29/2020   PLT 191 07/29/2020   No flowsheet data found. Edinburgh Score: Edinburgh Postnatal Depression Scale Screening Tool 07/29/2020  I have been able to laugh and see the funny side of things. 0  I have looked forward with enjoyment to things. 0  I have blamed myself unnecessarily when things went wrong. 1  I have been anxious or worried for no good reason. 0  I have felt scared or panicky for no good reason. 0  Things have been getting on top of me. 1  I have been so unhappy that I have had difficulty sleeping. 0  I have felt sad or miserable. 1  I have been so unhappy that I have been crying. 1  The thought of harming myself has occurred to me. 0  Edinburgh Postnatal Depression Scale Total 4     After visit meds:  Allergies as of 07/30/2020   No Known Allergies     Medication List    TAKE these medications   acetaminophen 325 MG tablet Commonly known as: Tylenol Take 2 tablets (650 mg total) by mouth every 6 (six) hours as needed for mild pain, moderate pain, fever or headache.  coconut oil Oil Apply 1 application topically as needed (nipple pain).   ibuprofen 600 MG tablet Commonly known as: ADVIL Take 1 tablet (600 mg total) by mouth every 6 (six) hours.   WesTab Plus 27-1 MG Tabs Take 1 tablet by mouth daily.       Discharge home in stable condition Infant Feeding: Bottle and Breast Infant Disposition:home with mother Discharge instruction: per After Visit Summary and Postpartum booklet. Activity: Advance as tolerated. Pelvic rest for 6 weeks.  Diet: routine diet Future Appointments:No future appointments. Follow up Visit:  Please schedule this patient for a In  person postpartum visit in 6 weeks with the following provider: Any provider. Additional Postpartum F/U:n/a  Low risk pregnancy complicated by: IUGR Delivery mode:  Vaginal, Spontaneous  Anticipated Birth Control:  Depo  Message sent to [facility] to schedule 6 week PP visit  Idona Stach, Gildardo Cranker, MD OB Fellow, Faculty Practice 07/30/2020 3:12 PM

## 2020-07-29 NOTE — Anesthesia Procedure Notes (Signed)
Epidural Patient location during procedure: OB Start time: 07/29/2020 4:00 PM End time: 07/29/2020 4:10 PM  Staffing Anesthesiologist: Mellody Dance, MD Performed: anesthesiologist   Preanesthetic Checklist Completed: patient identified, IV checked, site marked, risks and benefits discussed, monitors and equipment checked, pre-op evaluation and timeout performed  Epidural Patient position: sitting Prep: DuraPrep Patient monitoring: heart rate, cardiac monitor, continuous pulse ox and blood pressure Approach: midline Location: L3-L4 Injection technique: LOR saline  Needle:  Needle type: Tuohy  Needle gauge: 17 G Needle length: 9 cm Needle insertion depth: 4 cm Catheter type: closed end flexible Catheter size: 20 Guage Catheter at skin depth: 9 cm Test dose: negative and Other  Assessment Events: blood not aspirated, injection not painful, no injection resistance and negative IV test  Additional Notes Informed consent obtained prior to proceeding including risk of failure, 1% risk of PDPH, risk of minor discomfort and bruising.  Discussed rare but serious complications including epidural abscess, permanent nerve injury, epidural hematoma.  Discussed alternatives to epidural analgesia and patient desires to proceed.  Timeout performed pre-procedure verifying patient name, procedure, and platelet count.  Patient tolerated procedure well.

## 2020-07-29 NOTE — Anesthesia Preprocedure Evaluation (Addendum)
Anesthesia Evaluation  Patient identified by MRN, date of birth, ID band Patient awake    Reviewed: Allergy & Precautions, Patient's Chart, lab work & pertinent test results  Airway Mallampati: I  TM Distance: >3 FB Neck ROM: Full    Dental no notable dental hx.    Pulmonary neg pulmonary ROS,    Pulmonary exam normal breath sounds clear to auscultation       Cardiovascular Exercise Tolerance: Good negative cardio ROS Normal cardiovascular exam Rhythm:Regular Rate:Normal     Neuro/Psych negative neurological ROS  negative psych ROS   GI/Hepatic negative GI ROS, Neg liver ROS,   Endo/Other  negative endocrine ROS  Renal/GU negative Renal ROS  negative genitourinary   Musculoskeletal negative musculoskeletal ROS (+)   Abdominal   Peds negative pediatric ROS (+)  Hematology negative hematology ROS (+)   Anesthesia Other Findings   Reproductive/Obstetrics (+) Pregnancy                             Anesthesia Physical Anesthesia Plan  ASA: II  Anesthesia Plan: Epidural   Post-op Pain Management:    Induction:   PONV Risk Score and Plan: Treatment may vary due to age or medical condition  Airway Management Planned: Natural Airway  Additional Equipment:   Intra-op Plan:   Post-operative Plan:   Informed Consent: I have reviewed the patients History and Physical, chart, labs and discussed the procedure including the risks, benefits and alternatives for the proposed anesthesia with the patient or authorized representative who has indicated his/her understanding and acceptance.       Plan Discussed with: Anesthesiologist  Anesthesia Plan Comments:         Anesthesia Quick Evaluation

## 2020-07-29 NOTE — Progress Notes (Signed)
Kari Schmidt is a 25 y.o. G2P1001 at [redacted]w[redacted]d admitted for IOL for IUGR.  Subjective: Patient reports more increasingly painful contractions. She is considering an epidural soon.  Objective: BP 107/82   Pulse 75   Temp 98 F (36.7 C)   Resp 15   LMP 10/29/2019 (Exact Date)  No intake/output data recorded. No intake/output data recorded.  FHT: 140bpm, moderate variability, +accels, no decels UC: Q2-3 mins SVE:   Dilation: 3 Effacement (%): 50 Station: -1 Exam by:: Fabio Neighbors RN  Labs: Lab Results  Component Value Date   WBC 6.7 07/29/2020   HGB 14.0 07/29/2020   HCT 43.1 07/29/2020   MCV 85.9 07/29/2020   PLT 191 07/29/2020    Assessment / Plan: Kari Schmidt 25 y.o. G2P1 at 39.1wks here for IOL for IUGR  Labor: Progressing. Continue Pitocin titration prn per unit policy. Patient considering epidural soon. We discussed AROM once epidural is in and patient is agreeable once she is comfortable.  Fetal Wellbeing:  Category I Pain Control:  Epidural I/D:  GBS negative Anticipated MOD:  NSVD   Brand Males, MSN, CNM 07/29/2020, 3:23 PM

## 2020-07-29 NOTE — Lactation Note (Signed)
This note was copied from a baby's chart. Lactation Consultation Note  Patient Name: Kari Schmidt [id number] Date: 07/29/2020 Reason for consult: L&D Initial assessment;Term;Other (Comment) (SGA, IUGR) Age:26 hours  Visited with mom of 0 hours old FT female, she's a P2 and reported (+) breast changes during the pregnancy. Baby was already latched when entered the room, but he unlatched shortly. Assisted mom with hand expression and positioning, LC showed her how to finger feed baby.  She was worried that her nipples were too big for baby's mouth, but baby was able to latch and sustain the latch for the entire feeding; he had a big wide open mouth and rhythmical sucking with stimulation. NICU RN Kari Schmidt (shadow) also assisted mom with breast compressions. Baby still nursing when exiting the room at the 10 minutes mark.  Reviewed normal newborn behavior, cluster feeding, feeding cues, size of baby's stomach and lactogenesis II.  Feeding plan:  1. Encouraged mom to feed baby STS 8-12 times/24 hours or sooner if feeding cues are present 2. Hand expression and finger/spoon feeding were also encouraged  No literature provided due to the nature of this L&D consultation. No support person in mom's room, L&D RN Kari Schmidt reported she came alone to the hospital to have her baby. Mom reported all questions and concerns were answered, she's aware of LC OP services and will call PRN.  Maternal Data Has patient been taught Hand Expression?: Yes Does the patient have breastfeeding experience prior to this delivery?: Yes How long did the patient breastfeed?: 1 month  Feeding Mother's Current Feeding Choice: Breast Milk  LATCH Score Latch: Grasps breast easily, tongue down, lips flanged, rhythmical sucking. (after doing suck training with EBM)  Audible Swallowing: A few with stimulation  Type of Nipple: Everted at rest and after stimulation  Comfort (Breast/Nipple): Soft / non-tender  Hold (Positioning):  Assistance needed to correctly position infant at breast and maintain latch.  LATCH Score: 8   Lactation Tools Discussed/Used    Interventions Interventions: Breast feeding basics reviewed;Breast massage;Assisted with latch;Hand express;Skin to skin  Discharge WIC Program: Yes  Consult Status Consult Status: Follow-up Date: 07/30/20 Follow-up type: In-patient    Kari Schmidt 07/29/2020, 5:35 PM

## 2020-07-29 NOTE — H&P (Addendum)
OBSTETRIC ADMISSION HISTORY AND PHYSICAL  Kari Schmidt is a 25 y.o. female G2P1001 with IUP at [redacted]w[redacted]d by LMP presenting for IOL for IUGR. She reports +FMs, No LOF, no VB, no blurry vision, headaches or peripheral edema, and RUQ pain.  She plans on breast and bottle feeding. She request Depo for birth control. She received her prenatal care at  [facility]    Dating: By LMP --->  Estimated Date of Delivery: 08/04/20  Sono:   @[redacted]w[redacted]d , S<D, normal anatomy, cephalic presentation, 7% EFW   Prenatal History/Complications:  - IUGR - Late to prenatal care in third trimester  Past Medical History: Past Medical History:  Diagnosis Date   PROM (premature rupture of membranes) 10/06/2018   SVD (spontaneous vaginal delivery) 10/07/2018    Past Surgical History: Past Surgical History:  Procedure Laterality Date   NO PAST SURGERIES      Obstetrical History: OB History     Gravida  2   Para  1   Term  1   Preterm      AB      Living  1      SAB      IAB      Ectopic      Multiple  0   Live Births  1           Social History Social History   Socioeconomic History   Marital status: Single    Spouse name: Not on file   Number of children: Not on file   Years of education: Not on file   Highest education level: Not on file  Occupational History   Not on file  Tobacco Use   Smoking status: Never Smoker   Smokeless tobacco: Never Used  Vaping Use   Vaping Use: Never used  Substance and Sexual Activity   Alcohol use: Not Currently    Comment: 3 years ago   Drug use: Never   Sexual activity: Yes  Other Topics Concern   Not on file  Social History Narrative   Not on file   Social Determinants of Health   Financial Resource Strain: Not on file  Food Insecurity: Food Insecurity Present   Worried About Running Out of Food in the Last Year: Sometimes true   Ran Out of Food in the Last Year: Sometimes true  Transportation Needs: No Transportation Needs   Lack of  Transportation (Medical): No   Lack of Transportation (Non-Medical): No  Physical Activity: Not on file  Stress: Not on file  Social Connections: Not on file   Family History: Family History  Problem Relation Age of Onset   Asthma Mother    Allergies: No Known Allergies  Medications Prior to Admission  Medication Sig Dispense Refill Last Dose   Prenatal Vit-Fe Fumarate-FA (WESTAB PLUS) 27-1 MG TABS Take 1 tablet by mouth daily.      Review of Systems   All systems reviewed and negative except as stated in HPI  Blood pressure 128/80, pulse 71, temperature 98.3 F (36.8 C), temperature source Oral, resp. rate 16, last menstrual period 10/29/2019, unknown if currently breastfeeding. General appearance: alert, appears stated age and no distress Lungs: clear to auscultation bilaterally Heart: regular rate and rhythm Abdomen: soft, non-tender; bowel sounds normal Extremities: Homans sign is negative, no sign of DVT Presentation: cephalic Fetal monitoringBaseline: 140 bpm, Variability: moderate, Accelerations: present and Decelerations: Absent Uterine activity: Q2-5 minutes Dilation: 3 Effacement (%): 50 Station: -2 Exam by:: [email or web address] CNM  Prenatal  labs: ABO, Rh: --/--/O POS (06/02 1601) Antibody: NEG (06/02 0806) Rubella: Immune (01/18 0000) RPR: Non Reactive (04/27 0833)  HBsAg:   Pending HIV: Non Reactive (04/27 0932)  GBS: Negative/-- (05/16 1615)  2 hr Glucola 75/72/59 Genetic screening:  Quad negative, NIPS low risk female, AFP not done, Horizon CF negative Anatomy US  normal female   Prenatal Transfer Tool  Maternal Diabetes: No Genetic Screening: Normal Maternal Ultrasounds/Referrals: IUGR Fetal Ultrasounds or other Referrals:  None Maternal Substance Abuse:  No Significant Maternal Medications:  None Significant Maternal Lab Results: Group B Strep negative  Results for orders placed or performed during the hospital encounter of 07/29/20 (from the past  24 hour(s))  CBC   Collection Time: 07/29/20  8:06 AM  Result Value Ref Range   WBC 6.7 4.0 - 10.5 K/uL   RBC 5.02 3.87 - 5.11 MIL/uL   Hemoglobin 14.0 12.0 - 15.0 g/dL   HCT 35.5 73.2 - 20.2 %   MCV 85.9 80.0 - 100.0 fL   MCH 27.9 26.0 - 34.0 pg   MCHC 32.5 30.0 - 36.0 g/dL   RDW 54.2 70.6 - 23.7 %   Platelets 191 150 - 400 K/uL   nRBC 0.0 0.0 - 0.2 %  Type and screen   Collection Time: 07/29/20  8:06 AM  Result Value Ref Range   ABO/RH(D) PENDING    Antibody Screen PENDING    Sample Expiration      08/01/2020,2359 Performed at [facility], [address], [place], [place] [address]   Results for orders placed or performed during the hospital encounter of 07/28/20 (from the past 24 hour(s))  SARS CORONAVIRUS 2 (TAT 6-24 HRS) Nasopharyngeal Nasopharyngeal Swab   Collection Time: 07/28/20 11:46 AM   Specimen: Nasopharyngeal Swab  Result Value Ref Range   SARS Coronavirus 2 NEGATIVE NEGATIVE   Patient Active Problem List   Diagnosis Date Noted   IUGR (intrauterine growth restriction) affecting care of mother 07/12/2020   Alpha thalassemia silent carrier 06/10/2020   Supervision of other normal pregnancy, antepartum 05/21/2020   Assessment/Plan:  Aymar Whitfill is a 25 y.o. G2P1001 at [redacted]w[redacted]d here for IOL for IUGR  #Labor:given cervical exam and contractions will plan to recheck in 2-3 hours. If no cervical change or if contractions space out will plan to start pitocin. Consider AROM prn.  #Pain: Planning epidural #FWB: Category 1 #ID: GBS negative #MOF: Breast and bottle #MOC: Depo #Circ: Patient uncertain at this time.  #Hepatitis B unknown, ordered.     Edmon Crape, Student-PA  07/29/2020, 9:44 AM    I have seen and examined this patient and agree with above documentation in the PA student's note.   Brand Males, MSN, CNM 07/29/20. 10:14 AM

## 2020-07-30 MED ORDER — ACETAMINOPHEN 325 MG PO TABS: 650.0000 mg | ORAL_TABLET | Freq: Four times a day (QID) | ORAL | Status: DC | PRN

## 2020-07-30 MED ORDER — COCONUT OIL OIL: 1.0000 "application " | TOPICAL_OIL | 0 refills | Status: DC | PRN

## 2020-07-30 MED ORDER — MEDROXYPROGESTERONE ACETATE 150 MG/ML IM SUSP
150.0000 mg | Freq: Once | INTRAMUSCULAR | Status: AC
  Administered 2020-07-30: 150 mg via INTRAMUSCULAR
  Filled 2020-07-30: qty 1

## 2020-07-30 MED ORDER — IBUPROFEN 600 MG PO TABS: 600.0000 mg | ORAL_TABLET | Freq: Four times a day (QID) | ORAL | 0 refills | Status: DC
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
## 2020-07-30 NOTE — Progress Notes (Addendum)
POSTPARTUM PROGRESS NOTE Postpartum Day 1  Subjective: Magdelene Ruark is a 25 y.o. [id number] s/p vaginal delivery at [redacted]w[redacted]d following induction for IUGR.  She reports she doing well. No acute events overnight. She denies any problems with ambulating, voiding or po intake. Denies nausea or vomiting. Pain is well controlled.  Lochia is minimal.  Objective: Blood pressure 102/72, pulse 76, temperature 98.3 F (36.8 C), temperature source Oral, resp. rate 16, last menstrual period 10/29/2019, SpO2 98 %, unknown if currently breastfeeding.  Physical Exam:  General: alert, cooperative and no distress. Chest: no respiratory distress. Abdomen: soft, non-tender.  Uterine Fundus: firm and at level of umbilicus. Extremities: No calf swelling or tenderness  no edema.  Recent Labs    07/29/20 0806  HGB 14.0  HCT 43.1    Assessment/Plan: Kwana Ringel is a 25 y.o. [id number] s/p vaginal delivery at [redacted]w[redacted]d following induction for IUGR.  Routine Postpartum Care: PPD#1 -- Doing well, pain well-controlled.  -- Continue routine care, lactation support.  -- Social work consult due lack of support at home.  -- Contraception: Depo -- Feeding: Breast and bottle feeding.  Dispo: Plan for discharge tomorrow.  Edmon Crape, Student-PA 07/30/2020 8:26 AM

## 2020-07-30 NOTE — Discharge Instructions (Signed)

## 2020-07-30 NOTE — Anesthesia Postprocedure Evaluation (Signed)
Anesthesia Post Note  Patient: Kari Schmidt  Procedure(s) Performed: AN AD HOC LABOR EPIDURAL     Patient location during evaluation: Mother Baby Anesthesia Type: Epidural Level of consciousness: awake and alert and oriented Pain management: satisfactory to patient Vital Signs Assessment: post-procedure vital signs reviewed and stable Respiratory status: spontaneous breathing and nonlabored ventilation Cardiovascular status: stable Postop Assessment: no headache, no backache, no signs of nausea or vomiting, adequate PO intake, patient able to bend at knees and able to ambulate (patient up walking) Anesthetic complications: no   No complications documented.  Last Vitals:  Vitals:   07/30/20 0445 07/30/20 0845  BP: 102/72 115/66  Pulse: 76 72  Resp: 16 17  Temp: 36.8 C 36.7 C  SpO2: 98%     Last Pain:  Vitals:   07/30/20 0845  TempSrc: Oral  PainSc: 0-No pain   Pain Goal:                Epidural/Spinal Function Cutaneous sensation: Normal sensation (07/30/20 0845)  Madison Hickman

## 2020-08-03 LAB — SURGICAL PATHOLOGY

## 2020-08-18 ENCOUNTER — Other Ambulatory Visit: Payer: Self-pay

## 2020-08-18 ENCOUNTER — Ambulatory Visit: Payer: Medicaid Other

## 2020-08-18 NOTE — Progress Notes (Signed)
Error in scheduling. Pt did not have C/section delivery. Pt aware of PP visit on 09/09/20. Pt verbalized understanding.   Judeth Cornfield, RN

## 2020-09-09 ENCOUNTER — Encounter: Payer: Self-pay | Admitting: Obstetrics and Gynecology

## 2020-09-09 ENCOUNTER — Ambulatory Visit (INDEPENDENT_AMBULATORY_CARE_PROVIDER_SITE_OTHER): Payer: Medicaid Other | Admitting: Obstetrics and Gynecology

## 2020-09-09 ENCOUNTER — Other Ambulatory Visit: Payer: Self-pay

## 2020-09-09 NOTE — Progress Notes (Signed)
Post Partum Visit Note  Kari Schmidt is a 25 y.o. G70P2002 female who presents for a postpartum visit. She is 6 weeks postpartum following a normal spontaneous vaginal delivery.  I have fully reviewed the prenatal and intrapartum course. The delivery was at 39 gestational weeks.  Anesthesia: epidural. Postpartum course has been uncomplicated. Baby is doing well. Baby is feeding by both breast and bottle - Carnation Good Start Soy (more formula than breast milk). Bleeding staining only. Bowel function is normal. Bladder function is normal. Patient is not sexually active. Contraception method is Depo-Provera injections. Postpartum depression screening: negative.   The pregnancy intention screening data noted above was reviewed. Potential methods of contraception were discussed. The patient elected to proceed with No data recorded.   Edinburgh Postnatal Depression Scale - 09/09/20 1400       Edinburgh Postnatal Depression Scale:  In the Past 7 Days   I have been able to laugh and see the funny side of things. 0    I have looked forward with enjoyment to things. 0    I have blamed myself unnecessarily when things went wrong. 0    I have been anxious or worried for no good reason. 0    I have felt scared or panicky for no good reason. 0    Things have been getting on top of me. 0    I have been so unhappy that I have had difficulty sleeping. 0    I have felt sad or miserable. 0    I have been so unhappy that I have been crying. 0    The thought of harming myself has occurred to me. 0    Edinburgh Postnatal Depression Scale Total 0             Health Maintenance Due  Topic Date Due   Pneumococcal Vaccine 12-63 Years old (1 - PCV) Never done   HPV VACCINES (1 - 2-dose series) Never done   Hepatitis C Screening  Never done       Review of Systems Pertinent items noted in HPI and remainder of comprehensive ROS otherwise negative.  Objective:  BP 112/74   Pulse 88   Ht 5\' 2"  (1.575  m)   Wt 113 lb 6.4 oz (51.4 kg)   LMP  (LMP Unknown)   Breastfeeding Yes   BMI 20.74 kg/m    General:  alert, cooperative, and no distress   Breasts:  normal  Lungs: clear to auscultation bilaterally  Heart:  regular rate and rhythm  Abdomen: soft, non-tender; bowel sounds normal; no masses,  no organomegaly   Wound   GU exam:  normal       Assessment:    There are no diagnoses linked to this encounter.  Normal postpartum exam.   Plan:   Essential components of care per ACOG recommendations:  1.  Mood and well being: Patient with negative depression screening today. Reviewed local resources for support.    2. Infant care and feeding:  -Patient currently breastmilk feeding? Yes. Discussed returning to work and pumping.  -Social determinants of health (SDOH) reviewed in EPIC. No concerns  3. Sexuality, contraception and birth spacing - Patient does not want a pregnancy in the next year.  Desired family size is 2 children.  - Reviewed forms of contraception in tiered fashion. Patient desired Depo-Provera today.   - Discussed birth spacing of 18 months  4. Sleep and fatigue -Encouraged family/partner/community support of 4 hrs of uninterrupted  sleep to help with mood and fatigue  5. Physical Recovery  - Discussed patients delivery and complications. She describes her labor as good. - Patient had a Vaginal, no problems at delivery. Patient had a 1st degree laceration. Perineal healing reviewed. Patient expressed understanding - Patient has urinary incontinence? No. - Patient is safe to resume physical and sexual activity  6.  Health Maintenance - HM due items addressed Yes - Last pap smear 02/2020 normal Pap smear not done at today's visit.  -Breast Cancer screening indicated? No.   7. Chronic Disease/Pregnancy Condition follow up: None  - PCP follow up  Catalina Antigua, MD [facility], [facility]

## 2020-10-18 ENCOUNTER — Other Ambulatory Visit: Payer: Self-pay

## 2020-10-18 ENCOUNTER — Ambulatory Visit (INDEPENDENT_AMBULATORY_CARE_PROVIDER_SITE_OTHER): Payer: Medicaid Other

## 2020-10-18 VITALS — BP 107/79 | HR 88 | Wt 112.1 lb

## 2020-10-18 DIAGNOSIS — Z3042 Encounter for surveillance of injectable contraceptive: Secondary | ICD-10-CM

## 2020-10-18 MED ORDER — MEDROXYPROGESTERONE ACETATE 150 MG/ML IM SUSP
150.0000 mg | Freq: Once | INTRAMUSCULAR | Status: AC
  Administered 2020-10-18: 150 mg via INTRAMUSCULAR

## 2020-10-18 NOTE — Progress Notes (Signed)
Melvia Heaps here for Depo-Provera Injection. Pt reports intermittent vaginal spotting since last injection following delivery. Explained it can take up to 6 months for body to adjust to new birth control method. Explained that sometimes bleeding subsides with second dose of Depo Provera. Pt will call the office prior to next injection if she still has concern about bleeding. Injection administered without complication. Patient will return in 3 months for next injection between 01/03/21 and 01/17/21. Next annual visit due July 2023.   Marjo Bicker, RN 10/18/2020  1:47 PM

## 2021-01-03 ENCOUNTER — Ambulatory Visit (INDEPENDENT_AMBULATORY_CARE_PROVIDER_SITE_OTHER): Payer: Medicaid Other

## 2021-01-03 ENCOUNTER — Other Ambulatory Visit: Payer: Self-pay

## 2021-01-03 VITALS — BP 104/73 | HR 91 | Ht 62.0 in | Wt 109.8 lb

## 2021-01-03 DIAGNOSIS — Z3042 Encounter for surveillance of injectable contraceptive: Secondary | ICD-10-CM

## 2021-01-03 MED ORDER — MEDROXYPROGESTERONE ACETATE 150 MG/ML IM SUSP
150.0000 mg | Freq: Once | INTRAMUSCULAR | Status: AC
  Administered 2021-01-03: 150 mg via INTRAMUSCULAR

## 2021-01-03 NOTE — Progress Notes (Addendum)
Kari Schmidt here for Depo-Provera Injection. Injection administered without complication. Pt reports no problems and is happy with Depo. Patient will return in 3 months for next injection between Jan 23 and Apr 04, 2021. Next annual visit due July 2023.   Isabell Jarvis, RN 01/03/2021  1:42 PM   Patient was assessed and managed by nursing staff during this encounter. I have reviewed the chart and agree with the documentation and plan.  Currie Paris, NP 01/03/2021 5:25 PM

## 2021-03-22 ENCOUNTER — Ambulatory Visit (INDEPENDENT_AMBULATORY_CARE_PROVIDER_SITE_OTHER): Payer: Medicaid Other

## 2021-03-22 ENCOUNTER — Other Ambulatory Visit: Payer: Self-pay

## 2021-03-22 VITALS — BP 112/70 | HR 76 | Wt 111.2 lb

## 2021-03-22 DIAGNOSIS — Z3042 Encounter for surveillance of injectable contraceptive: Secondary | ICD-10-CM | POA: Diagnosis not present

## 2021-03-22 MED ORDER — MEDROXYPROGESTERONE ACETATE 150 MG/ML IM SUSP
150.0000 mg | Freq: Once | INTRAMUSCULAR | Status: AC
  Administered 2021-03-22: 14:00:00 150 mg via INTRAMUSCULAR

## 2021-03-22 NOTE — Progress Notes (Signed)
Kari Schmidt here for Depo-Provera Injection. Injection administered without complication. Patient will return in 3 months for next injection between 06/07/21 and 06/21/21. Next annual visit due July 2023.   Cline Crock, RN 03/22/2021

## 2021-06-07 ENCOUNTER — Ambulatory Visit (INDEPENDENT_AMBULATORY_CARE_PROVIDER_SITE_OTHER): Payer: Medicaid Other

## 2021-06-07 VITALS — BP 112/67 | HR 81 | Wt 110.2 lb

## 2021-06-07 DIAGNOSIS — Z3042 Encounter for surveillance of injectable contraceptive: Secondary | ICD-10-CM

## 2021-06-07 DIAGNOSIS — L819 Disorder of pigmentation, unspecified: Secondary | ICD-10-CM

## 2021-06-07 MED ORDER — MEDROXYPROGESTERONE ACETATE 150 MG/ML IM SUSP
150.0000 mg | Freq: Once | INTRAMUSCULAR | Status: AC
  Administered 2021-06-07: 150 mg via INTRAMUSCULAR

## 2021-06-07 NOTE — Progress Notes (Signed)
Kari Schmidt here for Depo-Provera Injection. Injection administered without complication. Patient will return in 3 months for next injection between June 27 and July 11. Next annual visit due with next depo.  ? ?Patient states she is concerned about discoloration on one of her labia majora that started a year or two ago. Per patient the discoloration has gotten bigger with time. Per patient the discoloration site is not painful or irritated, nor does it have any signs of infection. I reviewed this with Vonzella Nipple, PA. Per [name] patient should have referral to dermatology placed as well as see a provider at her next depo appointment. I explained all of this to the patient. Patient verbalized understanding and denies any other questions or concerns.  ? ?Cline Crock, RN ?06/07/2021   ?

## 2021-06-07 NOTE — Patient Instructions (Signed)
[facility] ?[phone] ?

## 2021-07-08 NOTE — Progress Notes (Signed)
? ?New Patient Office Visit ? ?Subjective   ? ?Patient ID: Kari Schmidt, female    DOB: 23-Mar-1995  Age: 26 y.o. MRN: [id number] ? ?CC:  ?Chief Complaint  ?Patient presents with  ? Establish Care  ?  Np. Est care. Pt c/o hx of hemorrhoids, lately has been causing difficulty In using bathroom w/ bleeding. Pt also states she has a dark spot on her Lt labia that has grown in size iin the last several months  ? ? ?HPI ?Kari Schmidt presents for new patient visit to establish care.  Introduced to Publishing rights manager role and practice setting.  All questions answered.  Discussed provider/patient relationship and expectations. ? ?She has a history of hemorrhoids for the past 3 years and it has been getting worse recently. She has a small amount of bright red bleeding with every bowel movement. She describes intermittent itching. She has tried a stool softener capsule a few times.  ? ?She has a dark spot on her left labia that has been there for about 3 year ago, however recently it has gotten bigger. Denies itching and pain. She mentioned it when she was getting her last depo injection, and the GYN stated they would look at her next visit.  ? ?Depression and Anxiety Screen Done: ? ? ?  07/11/2021  ?  3:09 PM 10/18/2020  ?  2:38 PM 07/20/2020  ?  4:21 PM 07/20/2020  ? 10:05 AM 06/02/2020  ?  3:52 PM  ?Depression screen PHQ 2/9  ?Decreased Interest 0 0 0 0 3  ?Down, Depressed, Hopeless 0 0 0 0 0  ?PHQ - 2 Score 0 0 0 0 3  ?Altered sleeping 0 0 3 0 3  ?Tired, decreased energy 3 0 3 0 3  ?Change in appetite 0 0 0 0 0  ?Feeling bad or failure about yourself  0 0 0 0 0  ?Trouble concentrating 0 0 0 0 0  ?Moving slowly or fidgety/restless 0 0 0 0 0  ?Suicidal thoughts 0 0 0 0 0  ?PHQ-9 Score 3 0 6 0 9  ?Difficult doing work/chores Somewhat difficult      ? ? ?  07/11/2021  ?  3:10 PM 10/18/2020  ?  2:38 PM 07/20/2020  ? 11:07 AM 06/02/2020  ?  3:53 PM  ?GAD 7 : Generalized Anxiety Score  ?Nervous, Anxious, on Edge 0 0 0 0  ?Control/stop worrying 0 0 0  0  ?Worry too much - different things 0 0 0 0  ?Trouble relaxing 0 0 0 0  ?Restless 0 0 0 0  ?Easily annoyed or irritable 0 0 0 0  ?Afraid - awful might happen 0 0 0 0  ?Total GAD 7 Score 0 0 0 0  ? ? ?Outpatient Encounter Medications as of 07/11/2021  ?Medication Sig  ? hydrocortisone (ANUSOL-HC) 25 MG suppository Place 1 suppository (25 mg total) rectally 2 (two) times daily.  ? medroxyPROGESTERone (DEPO-PROVERA) 150 MG/ML injection Inject 150 mg into the muscle every 3 (three) months.  ? polyethylene glycol powder (GLYCOLAX/MIRALAX) 17 GM/SCOOP powder Take 17 g by mouth daily.  ? ?No facility-administered encounter medications on file as of 07/11/2021.  ? ? ?Past Medical History:  ?Diagnosis Date  ? PROM (premature rupture of membranes) 10/06/2018  ? SVD (spontaneous vaginal delivery) 10/07/2018  ? ? ?Past Surgical History:  ?Procedure Laterality Date  ? NO PAST SURGERIES    ? ? ?Family History  ?Problem Relation Age of Onset  ? Asthma  Mother   ? ? ?Social History  ? ?Socioeconomic History  ? Marital status: Single  ?  Spouse name: Not on file  ? Number of children: Not on file  ? Years of education: Not on file  ? Highest education level: Not on file  ?Occupational History  ? Not on file  ?Tobacco Use  ? Smoking status: Never  ? Smokeless tobacco: Never  ?Vaping Use  ? Vaping Use: Never used  ?Substance and Sexual Activity  ? Alcohol use: Not Currently  ?  Comment: 3 years ago  ? Drug use: Never  ? Sexual activity: Yes  ?  Birth control/protection: Injection  ?Other Topics Concern  ? Not on file  ?Social History Narrative  ? Not on file  ? ?Social Determinants of Health  ? ?Financial Resource Strain: Not on file  ?Food Insecurity: Food Insecurity Present  ? Worried About Programme researcher, broadcasting/film/video in the Last Year: Sometimes true  ? Ran Out of Food in the Last Year: Sometimes true  ?Transportation Needs: No Transportation Needs  ? Lack of Transportation (Medical): No  ? Lack of Transportation (Non-Medical): No  ?Physical  Activity: Not on file  ?Stress: Not on file  ?Social Connections: Not on file  ?Intimate Partner Violence: Not on file  ? ? ?Review of Systems  ?Constitutional:  Positive for malaise/fatigue. Negative for fever.  ?HENT: Negative.    ?Eyes: Negative.   ?Respiratory: Negative.    ?Cardiovascular: Negative.   ?Gastrointestinal:  Positive for blood in stool and constipation. Negative for abdominal pain and nausea.  ?Genitourinary: Negative.   ?Musculoskeletal: Negative.   ?Skin:   ?     Spot on her left labia  ?Neurological: Negative.   ?Psychiatric/Behavioral: Negative.    ? ?  ?Objective   ? ?BP 102/78 (BP Location: Left Arm, Patient Position: Sitting, Cuff Size: Normal)   Pulse 88   Temp (!) 97.1 ?F (36.2 ?C) (Temporal)   Ht 5' 1.75" (1.568 m)   Wt 106 lb 9.6 oz (48.4 kg)   SpO2 98%   BMI 19.66 kg/m?  ? ?Physical Exam ?Vitals and nursing note reviewed. Exam conducted with a chaperone present.  ?Constitutional:   ?   General: She is not in acute distress. ?   Appearance: Normal appearance.  ?HENT:  ?   Head: Normocephalic.  ?   Right Ear: Tympanic membrane, ear canal and external ear normal.  ?   Left Ear: Tympanic membrane, ear canal and external ear normal.  ?Eyes:  ?   Conjunctiva/sclera: Conjunctivae normal.  ?Cardiovascular:  ?   Rate and Rhythm: Normal rate and regular rhythm.  ?   Pulses: Normal pulses.  ?   Heart sounds: Normal heart sounds.  ?Pulmonary:  ?   Effort: Pulmonary effort is normal.  ?   Breath sounds: Normal breath sounds.  ?Abdominal:  ?   Palpations: Abdomen is soft.  ?   Tenderness: There is no abdominal tenderness.  ?Genitourinary: ?   Exam position: Lithotomy position.  ?   Labia:     ?   Left: Lesion (brown discoloration along left labia) present.   ?   Rectum: External hemorrhoid (x2, non bleeding) present.  ?Musculoskeletal:  ?   Cervical back: Normal range of motion.  ?Skin: ?   General: Skin is warm.  ?Neurological:  ?   General: No focal deficit present.  ?   Mental Status: She is  alert and oriented to person, place, and time.  ?  Psychiatric:     ?   Mood and Affect: Mood normal.     ?   Behavior: Behavior normal.     ?   Thought Content: Thought content normal.     ?   Judgment: Judgment normal.  ? ? ?Last CBC ?Lab Results  ?Component Value Date  ? WBC 6.7 07/29/2020  ? HGB 14.0 07/29/2020  ? HCT 43.1 07/29/2020  ? MCV 85.9 07/29/2020  ? MCH 27.9 07/29/2020  ? RDW 13.5 07/29/2020  ? PLT 191 07/29/2020  ? ?  ? ?Assessment & Plan:  ? ?Problem List Items Addressed This Visit   ? ?  ? Cardiovascular and Mediastinum  ? Hemorrhoids - Primary  ?  2 external hemorrhoids noted along with possible internal hemorrhoid. Will start annusol suppository twice a day for 6 days. Will also have her start miralax daily, increase fluids, and increase fiber in her diet. She can do sitz baths as needed for comfort. Will place referral to surgery per patient request.  ? ?  ?  ? Relevant Orders  ? Ambulatory referral to General Surgery  ?  ? Genitourinary  ? Lesion of labia  ?  Brown discoloration noted on left labia. She follows with GYN and has an appointment to have this evaluated further next month.  ? ?  ?  ? ? ?Return in about 3 months (around 10/11/2021) for CPE.  ? ?Gerre ScullLauren A Lacora Folmer, NP ? ? ?

## 2021-07-11 ENCOUNTER — Encounter: Payer: Self-pay | Admitting: Nurse Practitioner

## 2021-07-11 ENCOUNTER — Ambulatory Visit (INDEPENDENT_AMBULATORY_CARE_PROVIDER_SITE_OTHER): Payer: Medicaid Other | Admitting: Nurse Practitioner

## 2021-07-11 VITALS — BP 102/78 | HR 88 | Temp 97.1°F | Ht 61.75 in | Wt 106.6 lb

## 2021-07-11 DIAGNOSIS — K649 Unspecified hemorrhoids: Secondary | ICD-10-CM | POA: Diagnosis not present

## 2021-07-11 DIAGNOSIS — N9089 Other specified noninflammatory disorders of vulva and perineum: Secondary | ICD-10-CM

## 2021-07-11 MED ORDER — HYDROCORTISONE ACETATE 25 MG RE SUPP: 25.0000 mg | Freq: Two times a day (BID) | RECTAL | 0 refills | Status: DC

## 2021-07-11 MED ORDER — POLYETHYLENE GLYCOL 3350 17 GM/SCOOP PO POWD: 17.0000 g | Freq: Every day | ORAL | 1 refills | Status: DC

## 2021-07-11 NOTE — Assessment & Plan Note (Signed)
Brown discoloration noted on left labia. She follows with GYN and has an appointment to have this evaluated further next month.  ?

## 2021-07-11 NOTE — Assessment & Plan Note (Signed)
2 external hemorrhoids noted along with possible internal hemorrhoid. Will start annusol suppository twice a day for 6 days. Will also have her start miralax daily, increase fluids, and increase fiber in her diet. She can do sitz baths as needed for comfort. Will place referral to surgery per patient request.  ?

## 2021-07-11 NOTE — Patient Instructions (Signed)
It was great to see you! ? ?Start miralax powder 1 capful daily with any liquid (juice, water, coffee). This will help keep your bowel movements soft. Increase the amount of fiber you are eating in your diet and make sure you are drinking plenty of fluids.  ? ?You can use the annusol suppository twice a day for 6 days to help with swelling and bleeding.  ? ?Follow-up with your GYN on 08/24/21 at 1:15pm ? ?Let's follow-up in 3 months, sooner if you have concerns. ? ?If a referral was placed today, you will be contacted for an appointment. Please note that routine referrals can sometimes take up to 3-4 weeks to process. Please call our office if you haven't heard anything after this time frame. ? ?Take care, ? ?Rodman Pickle, NP ? ?

## 2021-07-15 ENCOUNTER — Encounter: Payer: Self-pay | Admitting: Nurse Practitioner

## 2021-08-24 ENCOUNTER — Other Ambulatory Visit: Payer: Self-pay

## 2021-08-24 ENCOUNTER — Ambulatory Visit (INDEPENDENT_AMBULATORY_CARE_PROVIDER_SITE_OTHER): Payer: Medicaid Other | Admitting: Certified Nurse Midwife

## 2021-08-24 ENCOUNTER — Other Ambulatory Visit (HOSPITAL_COMMUNITY)
Admission: RE | Admit: 2021-08-24 | Discharge: 2021-08-24 | Disposition: A | Discharge status: Home / Self Care | Payer: Medicaid Other | Source: Ambulatory Visit | Attending: Certified Nurse Midwife | Admitting: Certified Nurse Midwife

## 2021-08-24 ENCOUNTER — Encounter: Payer: Self-pay | Admitting: Certified Nurse Midwife

## 2021-08-24 VITALS — BP 123/84 | HR 106 | Wt 104.9 lb

## 2021-08-24 DIAGNOSIS — Z01419 Encounter for gynecological examination (general) (routine) without abnormal findings: Secondary | ICD-10-CM | POA: Diagnosis present

## 2021-08-24 DIAGNOSIS — Z113 Encounter for screening for infections with a predominantly sexual mode of transmission: Secondary | ICD-10-CM

## 2021-08-24 DIAGNOSIS — N9089 Other specified noninflammatory disorders of vulva and perineum: Secondary | ICD-10-CM | POA: Diagnosis not present

## 2021-08-24 DIAGNOSIS — Z3042 Encounter for surveillance of injectable contraceptive: Secondary | ICD-10-CM | POA: Diagnosis not present

## 2021-08-24 MED ORDER — MEDROXYPROGESTERONE ACETATE 150 MG/ML IM SUSP
150.0000 mg | Freq: Once | INTRAMUSCULAR | Status: AC
  Administered 2021-08-24: 150 mg via INTRAMUSCULAR

## 2021-08-25 LAB — HEPATITIS B SURFACE ANTIGEN: Hepatitis B Surface Ag: NEGATIVE

## 2021-08-25 LAB — CERVICOVAGINAL ANCILLARY ONLY
Chlamydia: NEGATIVE
Comment: NEGATIVE
Comment: NEGATIVE
Comment: NORMAL
Neisseria Gonorrhea: NEGATIVE
Trichomonas: NEGATIVE

## 2021-08-25 LAB — HEPATITIS C ANTIBODY: Hep C Virus Ab: NONREACTIVE

## 2021-08-25 LAB — HIV ANTIBODY (ROUTINE TESTING W REFLEX): HIV Screen 4th Generation wRfx: NONREACTIVE

## 2021-08-25 LAB — RPR: RPR Ser Ql: NONREACTIVE

## 2021-08-25 NOTE — Progress Notes (Signed)
ANNUAL EXAM Patient name: Kari Schmidt MRN [id number]  Date of birth: [birth year] Chief Complaint:   Gynecologic Exam  History of Present Illness:   Kari Schmidt is a 26 y.o. G44P2002  Asian  female being seen today for a routine annual exam.  Current complaints: noticing a change in the way her vulva looks, was referred to dermatology last year but her insurance would not cover the dermatologist.  No LMP recorded. Patient has had an injection.  Upstream - 08/25/21 1856       Pregnancy Intention Screening   Does the patient want to become pregnant in the next year? No    Does the patient's partner want to become pregnant in the next year? No    Would the patient like to discuss contraceptive options today? N/A   using depo, has no complaints and will continue using     Contraception Wrap Up   Contraception Counseling Provided No            The pregnancy intention screening data noted above was reviewed. The pregnancy intention screening data noted above was reviewed.   Last pap 03/16/20. Results were: NILM w/ HRHPV negative. H/O abnormal pap: no Last mammogram: Never (age). Family h/o breast cancer: no Last colonoscopy: Never (age). Family h/o colorectal cancer: no     08/24/2021    2:06 PM 07/11/2021    3:09 PM 10/18/2020    2:38 PM 07/20/2020    4:21 PM 07/20/2020   10:05 AM  Depression screen PHQ 2/9  Decreased Interest 0 0 0 0 0  Down, Depressed, Hopeless 0 0 0 0 0  PHQ - 2 Score 0 0 0 0 0  Altered sleeping 0 0 0 3 0  Tired, decreased energy 2 3 0 3 0  Change in appetite 0 0 0 0 0  Feeling bad or failure about yourself  0 0 0 0 0  Trouble concentrating 0 0 0 0 0  Moving slowly or fidgety/restless 0 0 0 0 0  Suicidal thoughts 0 0 0 0 0  PHQ-9 Score 2 3 0 6 0  Difficult doing work/chores  Somewhat difficult           08/24/2021    2:07 PM 07/11/2021    3:10 PM 10/18/2020    2:38 PM 07/20/2020   11:07 AM  GAD 7 : Generalized Anxiety Score  Nervous, Anxious, on Edge 0 0 0 0   Control/stop worrying 0 0 0 0  Worry too much - different things 0 0 0 0  Trouble relaxing 0 0 0 0  Restless 0 0 0 0  Easily annoyed or irritable 0 0 0 0  Afraid - awful might happen 0 0 0 0  Total GAD 7 Score 0 0 0 0     Review of Systems:   Pertinent items are noted in HPI Denies any headaches, blurred vision, fatigue, shortness of breath, chest pain, abdominal pain, abnormal vaginal discharge/itching/odor/irritation, problems with periods, bowel movements, urination, or intercourse unless otherwise stated above. Pertinent History Reviewed:  Reviewed past medical,surgical, social and family history.  Reviewed problem list, medications and allergies. Physical Assessment:   Vitals:   08/24/21 1323  BP: 123/84  Pulse: (!) 106  Weight: 104 lb 14.4 oz (47.6 kg)   Body mass index is 19.34 kg/m.   Physical Examination:  General appearance - well appearing, and in no distress Mental status - alert, oriented to person, place, and time Psych:  She has a  normal mood and affect Skin - warm and dry, normal color, no suspicious lesions noted Chest - effort normal, no problems with respiration noted Heart - normal rate and regular rhythm Neck:  midline trachea, no thyromegaly or nodules Breasts - breasts appear normal Abdomen - soft, nontender, nondistended, no masses or organomegaly Pelvic - VULVA: inner vulva and labia minora a light pink color, labia majora a darker brown color consistent with patient's skin tone. Appears to be brown melanated skin encroaching on the pink inner vulva (see photos in Media tab). Edges are irregular with some areas darker than others. Areas present on the upper left corner, bottom right corner and clitoral hood. Noticed these spots after her last delivery but they have grown since then. Extremities:  No swelling or varicosities noted  Chaperone present for exam  No results found for this or any previous visit (from the past 24 hour(s)).  Assessment &  Plan:  1. Encounter for annual routine gynecological examination - Routine preventative health maintenance measures emphasized. - Please refer to After Visit Summary for other counseling recommendations.  - Will follow up results of pap smear and manage accordingly.  2. Depo-Provera contraceptive status - medroxyPROGESTERone (DEPO-PROVERA) injection 150 mg  3. Screening examination for STD (sexually transmitted disease) - Cervicovaginal ancillary only( [facility]) - Hepatitis B Surface AntiGEN - Hepatitis C Antibody - HIV antibody (with reflex) - RPR  4. Vulvar lesion - Reviewed images with Dr. Shawnie Pons who recommended patient be rescheduled with a gyn MD for punch biopsy.  Mammogram: @ 27yo, or sooner if problems Colonoscopy: @ 27yo, or sooner if problems  Orders Placed This Encounter  Procedures   Hepatitis B Surface AntiGEN   Hepatitis C Antibody   HIV antibody (with reflex)   RPR   Meds:  Meds ordered this encounter  Medications   medroxyPROGESTERone (DEPO-PROVERA) injection 150 mg   Follow-up: Return in about 1 week (around 08/31/2021) for GYN visit - vaginal lesion.  Bernerd Limbo, CNM 08/25/2021 6:56 PM

## 2021-09-02 ENCOUNTER — Ambulatory Visit: Payer: Medicaid Other | Admitting: Obstetrics and Gynecology

## 2021-09-05 NOTE — Progress Notes (Unsigned)
VULVAR BIOPSY NOTE The indications for vulvar biopsy  for change in pigmentation  were reviewed.   Risks of the biopsy including pain, bleeding, infection, inadequate specimen, scarring and need for additional procedures  were discussed. The patient stated understanding and agreed to undergo procedure today. Consent was signed,  time out performed.   The patient's vulva was prepped with Betadine. 1% lidocaine was injected into the perineal area of biopsy. A 3-mm punch biopsy was done, biopsy tissue was picked up with sterile forceps. Only a small amount of tissue present so I attempted a 34mm biopsy and this did not get more tissue.  Small bleeding was noted and hemostasis was achieved using silver nitrate stick x1.  The patient tolerated the procedure well.   Physical Exam Genitourinary:      Post-procedure instructions (pelvic rest for one week) were given to the patient. The patient is to call with heavy bleeding, fever greater than 100.4, foul smelling vaginal discharge or other concerns. The patient will be return to clinic in two weeks for discussion of results.  Milas Hock, MD Attending Obstetrician & Gynecologist, [facility] for [facility], [facility]

## 2021-09-07 ENCOUNTER — Ambulatory Visit (INDEPENDENT_AMBULATORY_CARE_PROVIDER_SITE_OTHER): Payer: Medicaid Other | Admitting: Obstetrics and Gynecology

## 2021-09-07 ENCOUNTER — Other Ambulatory Visit (HOSPITAL_COMMUNITY)
Admission: RE | Admit: 2021-09-07 | Discharge: 2021-09-07 | Disposition: A | Discharge status: Home / Self Care | Payer: Medicaid Other | Source: Ambulatory Visit | Attending: Obstetrics and Gynecology | Admitting: Obstetrics and Gynecology

## 2021-09-07 ENCOUNTER — Other Ambulatory Visit: Payer: Self-pay

## 2021-09-07 ENCOUNTER — Encounter: Payer: Self-pay | Admitting: Obstetrics and Gynecology

## 2021-09-07 VITALS — BP 101/68 | HR 80 | Ht 61.0 in | Wt 103.0 lb

## 2021-09-07 DIAGNOSIS — N9089 Other specified noninflammatory disorders of vulva and perineum: Secondary | ICD-10-CM | POA: Diagnosis present

## 2021-09-12 ENCOUNTER — Telehealth: Payer: Self-pay | Admitting: Nurse Practitioner

## 2021-09-12 NOTE — Telephone Encounter (Signed)
Pt would like her referral from 07/11/21 for hemorrhoids to be changed to a different office, they do not take her insurance

## 2021-09-14 LAB — SURGICAL PATHOLOGY

## 2021-09-15 NOTE — Telephone Encounter (Signed)
Kari Schmidt was contacted and informed, she needs to contact insurance to see who is in her network. She stated she asked a few months ago and never got a response. She stated she will try again to find out

## 2021-10-10 NOTE — Progress Notes (Unsigned)
There were no vitals taken for this visit.   Subjective:    Patient ID: Kari Schmidt, female    DOB: 08-06-1995, 26 y.o.   MRN: [id number]  CC: No chief complaint on file.  HPI: Kari Schmidt is a 26 y.o. female presenting on 10/11/2021 for comprehensive medical examination. Current medical complaints include:{Blank single:19197::"none","***"}  She currently lives with: Menopausal Symptoms: {Blank single:19197::"yes","no"}  Depression Screen done today and results listed below:     09/07/2021    3:12 PM 08/24/2021    2:06 PM 07/11/2021    3:09 PM 10/18/2020    2:38 PM 07/20/2020    4:21 PM  Depression screen PHQ 2/9  Decreased Interest 0 0 0 0 0  Down, Depressed, Hopeless 0 0 0 0 0  PHQ - 2 Score 0 0 0 0 0  Altered sleeping 0 0 0 0 3  Tired, decreased energy 0 2 3 0 3  Change in appetite 0 0 0 0 0  Feeling bad or failure about yourself  0 0 0 0 0  Trouble concentrating 0 0 0 0 0  Moving slowly or fidgety/restless 0 0 0 0 0  Suicidal thoughts 0 0 0 0 0  PHQ-9 Score 0 2 3 0 6  Difficult doing work/chores   Somewhat difficult      The patient {has/does not LYYT:03546} a history of falls. I {did/did not:19850} complete a risk assessment for falls. A plan of care for falls {was/was not:19852} documented.   Past Medical History:  Past Medical History:  Diagnosis Date   PROM (premature rupture of membranes) 10/06/2018   SVD (spontaneous vaginal delivery) 10/07/2018    Surgical History:  Past Surgical History:  Procedure Laterality Date   NO PAST SURGERIES      Medications:  Current Outpatient Medications on File Prior to Visit  Medication Sig   medroxyPROGESTERone (DEPO-PROVERA) 150 MG/ML injection Inject 150 mg into the muscle every 3 (three) months.   No current facility-administered medications on file prior to visit.    Allergies:  No Known Allergies  Social History:   Social History   Tobacco Use  Smoking Status Never  Smokeless Tobacco Never   Social History    Substance and Sexual Activity  Alcohol Use Not Currently   Comment: 3 years ago    Family History:  Family History  Problem Relation Age of Onset   Asthma Mother     Past medical history, surgical history, medications, allergies, family history and social history reviewed with patient today and changes made to appropriate areas of the chart.   ROS All other ROS negative except what is listed above and in the HPI.      Objective:    There were no vitals taken for this visit.  Wt Readings from Last 3 Encounters:  09/07/21 103 lb (46.7 kg)  08/24/21 104 lb 14.4 oz (47.6 kg)  07/11/21 106 lb 9.6 oz (48.4 kg)    Physical Exam  Results for orders placed or performed in visit on 09/07/21  Surgical pathology( [facility])  Result Value Ref Range   SURGICAL PATHOLOGY      SURGICAL PATHOLOGY CASE: [id number] PATIENT: Zriyah Lotz Surgical Pathology Report     Clinical History: Vulvar lesion (ms)     FINAL MICROSCOPIC DIAGNOSIS:  A.   VULVA, BIOPSY: LABIAL MELANOTIC MACULE  MICROSCOPIC DESCRIPTION: There are melanocytes along the junctional zone without nest formation or appreciable atypia.  There is hyperpigmentation along the basal layer  with no appreciable atypia.  A Melan-A stain highlights the melanocytes. The Signa Kell stain highlights the melanin pigment at the basal cell layer and dermis. The PAS stain is negative for fungal organisms. This correlates with a labial melanotic macule or hyperpigmented melanotic macule.    GROSS DESCRIPTION:  Received in formalin is a 0.3 x 0.2 x 0.1 cm fragment of soft tan-brown tissue.  The specimen is submitted in toto.  [facility] 09/08/2021)   Final Diagnosis performed by Darryll Capers, MD.   Electronically signed 09/14/2021 Technical component performed at [employer]. [facility], [address], [place], [place] [address].  Professional component performed at [facility]. [address], [address], [place], [place] [address].  Immunohistochemistry Technical component (if applicable) was performed at [facility]. [address], [address], [place], [place] [address].   IMMUNOHISTOCHEMISTRY DISCLAIMER (if applicable): Some of these immunohistochemical stains may have been developed and the performance characteristics determine by [facility]. Some may not have been cleared or approved by the U.S. Food and Drug Administration. The FDA has determined that such clearance or approval is not necessary. This test is used for clinical purposes. It should not be regarded as investigational or for research. This laboratory is certified under the Clinical Laboratory Improvement Amendments of 1988 (CLIA-88) as qualified to perform high complexity clinical laboratory testing.  The controls stained app ropriately.       Assessment & Plan:   Problem List Items Addressed This Visit   None    Follow up plan: No follow-ups on file.   LABORATORY TESTING:  - Pap smear: up to date  IMMUNIZATIONS:   - Tdap: Tetanus vaccination status reviewed: last tetanus booster within 10 years. - Influenza: Postponed to flu season - Pneumovax: Not applicable - Prevnar: Not applicable - HPV: Up to date - Zostavax vaccine: Not applicable  SCREENING: -Mammogram: Not applicable  - Colonoscopy: Not applicable  - Bone Density: Not applicable  -Hearing Test: Not applicable  -Spirometry: Not applicable   PATIENT COUNSELING:   Advised to take 1 mg of folate supplement per day if capable of pregnancy.   Sexuality: Discussed sexually transmitted diseases, partner selection, use of condoms, avoidance of unintended pregnancy  and contraceptive alternatives.   Advised to avoid cigarette smoking.  I discussed with the patient that most people either abstain from alcohol or drink within safe limits (<=14/week and <=4 drinks/occasion for males,  <=7/weeks and <= 3 drinks/occasion for females) and that the risk for alcohol disorders and other health effects rises proportionally with the number of drinks per week and how often a drinker exceeds daily limits.  Discussed cessation/primary prevention of drug use and availability of treatment for abuse.   Diet: Encouraged to adjust caloric intake to maintain  or achieve ideal body weight, to reduce intake of dietary saturated fat and total fat, to limit sodium intake by avoiding high sodium foods and not adding table salt, and to maintain adequate dietary potassium and calcium preferably from fresh fruits, vegetables, and low-fat dairy products.    stressed the importance of regular exercise  Injury prevention: Discussed safety belts, safety helmets, smoke detector, smoking near bedding or upholstery.   Dental health: Discussed importance of regular tooth brushing, flossing, and dental visits.    NEXT PREVENTATIVE PHYSICAL DUE IN 1 YEAR. No follow-ups on file.

## 2021-10-11 ENCOUNTER — Ambulatory Visit (INDEPENDENT_AMBULATORY_CARE_PROVIDER_SITE_OTHER): Payer: Medicaid Other | Admitting: Nurse Practitioner

## 2021-10-11 ENCOUNTER — Encounter: Payer: Self-pay | Admitting: Nurse Practitioner

## 2021-10-11 VITALS — BP 94/70 | HR 95 | Temp 97.9°F | Wt 105.6 lb

## 2021-10-11 DIAGNOSIS — R5383 Other fatigue: Secondary | ICD-10-CM | POA: Diagnosis not present

## 2021-10-11 DIAGNOSIS — K649 Unspecified hemorrhoids: Secondary | ICD-10-CM

## 2021-10-11 DIAGNOSIS — L80 Vitiligo: Secondary | ICD-10-CM | POA: Diagnosis not present

## 2021-10-11 DIAGNOSIS — Z0000 Encounter for general adult medical examination without abnormal findings: Secondary | ICD-10-CM | POA: Diagnosis not present

## 2021-10-11 LAB — COMPREHENSIVE METABOLIC PANEL
ALT: 14 U/L (ref 0–35)
AST: 18 U/L (ref 0–37)
Albumin: 5 g/dL (ref 3.5–5.2)
Alkaline Phosphatase: 41 U/L (ref 39–117)
BUN: 13 mg/dL (ref 6–23)
CO2: 24 mEq/L (ref 19–32)
Calcium: 9.6 mg/dL (ref 8.4–10.5)
Chloride: 102 mEq/L (ref 96–112)
Creatinine, Ser: 0.9 mg/dL (ref 0.40–1.20)
GFR: 87.9 mL/min (ref >60.00)
Glucose, Bld: 90 mg/dL (ref 70–99)
Potassium: 3.8 mEq/L (ref 3.5–5.1)
Sodium: 138 mEq/L (ref 135–145)
Total Bilirubin: 0.8 mg/dL (ref 0.2–1.2)
Total Protein: 8 g/dL (ref 6.0–8.3)

## 2021-10-11 LAB — CBC WITH DIFFERENTIAL/PLATELET
Basophils Absolute: 0.1 10*3/uL (ref 0.0–0.1)
Basophils Relative: 1.1 % (ref 0.0–3.0)
Eosinophils Absolute: 0.2 10*3/uL (ref 0.0–0.7)
Eosinophils Relative: 2.8 % (ref 0.0–5.0)
HCT: 43 % (ref 36.0–46.0)
Hemoglobin: 13.9 g/dL (ref 12.0–15.0)
Lymphocytes Relative: 23.2 % (ref 12.0–46.0)
Lymphs Abs: 1.8 10*3/uL (ref 0.7–4.0)
MCHC: 32.4 g/dL (ref 30.0–36.0)
MCV: 84.9 fl (ref 78.0–100.0)
Monocytes Absolute: 0.3 10*3/uL (ref 0.1–1.0)
Monocytes Relative: 4.2 % (ref 3.0–12.0)
Neutro Abs: 5.4 10*3/uL (ref 1.4–7.7)
Neutrophils Relative %: 68.7 % (ref 43.0–77.0)
Platelets: 201 10*3/uL (ref 150.0–400.0)
RBC: 5.06 Mil/uL (ref 3.87–5.11)
RDW: 13.8 % (ref 11.5–15.5)
WBC: 7.9 10*3/uL (ref 4.0–10.5)

## 2021-10-11 LAB — TSH: TSH: 1.86 u[IU]/mL (ref 0.35–5.50)

## 2021-10-11 NOTE — Assessment & Plan Note (Signed)
She was diagnosed with vitiligo from her GYN.  We will place referral to dermatology.  We will also check ANA to rule out any other autoimmune conditions.

## 2021-10-11 NOTE — Patient Instructions (Signed)
It was great to see you!  Continue miralax as needed.   Start preparation H over the counter. I have placed a referral to GI for further evaluation.   I have also placed a referral to dermatology.   Let's follow-up in 6 months, sooner if you have concerns.  If a referral was placed today, you will be contacted for an appointment. Please note that routine referrals can sometimes take up to 3-4 weeks to process. Please call our office if you haven't heard anything after this time frame.  Take care,  Rodman Pickle, NP

## 2021-10-11 NOTE — Assessment & Plan Note (Signed)
Chronic, ongoing.  She was unable to get the Anusol from the pharmacy because her insurance did not cover it.  We will have her start Preparation H over-the-counter.  Continue MiraLAX daily as needed.  Referral placed to GI.

## 2021-10-12 LAB — ANA W/REFLEX: Anti Nuclear Antibody (ANA): NEGATIVE

## 2021-11-09 ENCOUNTER — Other Ambulatory Visit: Payer: Self-pay

## 2021-11-09 ENCOUNTER — Ambulatory Visit (INDEPENDENT_AMBULATORY_CARE_PROVIDER_SITE_OTHER): Payer: Medicaid Other

## 2021-11-09 VITALS — BP 106/74 | HR 76 | Wt 105.5 lb

## 2021-11-09 DIAGNOSIS — Z3042 Encounter for surveillance of injectable contraceptive: Secondary | ICD-10-CM

## 2021-11-09 MED ORDER — MEDROXYPROGESTERONE ACETATE 150 MG/ML IM SUSP
150.0000 mg | Freq: Once | INTRAMUSCULAR | Status: AC
  Administered 2021-11-09: 150 mg via INTRAMUSCULAR

## 2021-11-09 NOTE — Progress Notes (Signed)
Kari Schmidt here for Depo-Provera Injection. Pt states she would like this to be her last Depo injection. States she desires break from hormonal contraceptives. Pt does not desire pregnancy. Reports infrequent intercourse. Reviewed available methods of contraception included non hormonal options. Injection administered without complication. Explained to patient that she will be protected from pregnancy until 02/08/22. Next annual visit due June 2024.   Marjo Bicker, RN 11/09/2021  2:18 PM

## 2021-12-14 ENCOUNTER — Encounter: Payer: Self-pay | Admitting: Physician Assistant

## 2021-12-21 ENCOUNTER — Other Ambulatory Visit: Payer: Self-pay

## 2021-12-21 ENCOUNTER — Ambulatory Visit (INDEPENDENT_AMBULATORY_CARE_PROVIDER_SITE_OTHER): Payer: Medicaid Other

## 2021-12-21 VITALS — BP 108/83 | HR 77

## 2021-12-21 DIAGNOSIS — N898 Other specified noninflammatory disorders of vagina: Secondary | ICD-10-CM

## 2021-12-21 MED ORDER — FLUCONAZOLE 150 MG PO TABS: 150.0000 mg | ORAL_TABLET | Freq: Once | ORAL | 0 refills | Status: AC

## 2021-12-21 NOTE — Progress Notes (Unsigned)
Here today with complaint of vaginal itching. Also desires STD screening due to unprotected intercourse with new partner in recent past. Self swab collected. Pt states she would like to continue Depo Provera for contraception and will schedule appt at checkout. Pt is concerned about Depo Provera affect on bone density. Reviewed this is a concern with long term usage, but this does not apply to patient situation. Recommended vitamin D + Calcium supplement and weight bearing exercise.   Apolonio Schneiders RN 12/21/21

## 2021-12-22 ENCOUNTER — Other Ambulatory Visit (HOSPITAL_COMMUNITY)
Admission: RE | Admit: 2021-12-22 | Discharge: 2021-12-22 | Disposition: A | Discharge status: Home / Self Care | Payer: Medicaid Other | Source: Ambulatory Visit | Attending: Family Medicine | Admitting: Family Medicine

## 2021-12-22 DIAGNOSIS — N898 Other specified noninflammatory disorders of vagina: Secondary | ICD-10-CM | POA: Diagnosis present

## 2021-12-23 LAB — CERVICOVAGINAL ANCILLARY ONLY
Bacterial Vaginitis (gardnerella): POSITIVE — AB
Candida Glabrata: NEGATIVE
Candida Vaginitis: POSITIVE — AB
Chlamydia: NEGATIVE
Comment: NEGATIVE
Comment: NEGATIVE
Comment: NEGATIVE
Comment: NEGATIVE
Comment: NEGATIVE
Comment: NORMAL
Neisseria Gonorrhea: NEGATIVE
Trichomonas: NEGATIVE

## 2021-12-29 ENCOUNTER — Other Ambulatory Visit: Payer: Self-pay | Admitting: Certified Nurse Midwife

## 2021-12-29 DIAGNOSIS — B3731 Acute candidiasis of vulva and vagina: Secondary | ICD-10-CM

## 2021-12-29 DIAGNOSIS — B9689 Other specified bacterial agents as the cause of diseases classified elsewhere: Secondary | ICD-10-CM

## 2021-12-29 MED ORDER — METRONIDAZOLE 500 MG PO TABS: 500.0000 mg | ORAL_TABLET | Freq: Two times a day (BID) | ORAL | 0 refills | Status: DC

## 2021-12-29 MED ORDER — TERCONAZOLE 0.4 % VA CREA: 1.0000 | TOPICAL_CREAM | Freq: Every day | VAGINAL | 0 refills | Status: DC

## 2022-01-09 ENCOUNTER — Encounter: Payer: Self-pay | Admitting: *Deleted

## 2022-01-13 NOTE — Progress Notes (Unsigned)
01/16/2022 Kari Schmidt [id number] May 13, 1995  Referring provider: Gerre Scull, NP Primary GI doctor: Dr. Tomasa Rand  ASSESSMENT AND PLAN:   Chronic idiopathic constipation Recent normal TSH Possible component of pelvic floor PT No bloating/AB pain - Increase fiber/ water intake, decrease caffeine, increase activity level. -Will add on Miralax daily If symptoms persist could do colonoscopy versus anal rectal manometry to rule out intraluminal pathology  Prolapsed internal hemorrhoids Appears to have prolapsed internal hemorrhoids -Sitz baths, increase fiber, increase water -Hydrocortisone supp given and external cream sent in.  -We discussed hemorrhoid banding here in the office for internal hemorrhoids if not improving with conservative treatment. - follow up for evaluation here in the office.     Possible  Patient Care Team: Gerre Scull, NP as PCP - General (Internal Medicine)  HISTORY OF PRESENT ILLNESS: 26 y.o. female with a past medical history of vitiligo and others listed below presents for evaluation of hemorrhoids.   She has history of constipation.  She has 29 sons, 60 year old son and 59 month old son.  Hemorrhoids got worse after children.  She has had worse symptoms for 4 years, worse after birth of her children.  She states she had an episode in May of large volume bleeding into the toilet and was referred here.  She has BM every day but straining and hard. Incomplete BM. Having bleeding, pain with wiping, some mild rectal itching.  No Ab pain, urgency, no mucus.  She was on miralax for a few days that helped but she stops.  She took magnesium too that helped. She has not been on anything for several weeks.  She states the blood has been better until the last 1-2 weeks. No family history of colon cancer.  No weight gain or weight loss.  Normal thyroid, no anemia in Sept.   She  reports that she has never smoked. She has never used smokeless  tobacco. She reports that she does not currently use alcohol. She reports that she does not use drugs.  Current Medications:   Current Outpatient Medications (Endocrine & Metabolic):    medroxyPROGESTERone (DEPO-PROVERA) 150 MG/ML injection, Inject 150 mg into the muscle every 3 (three) months.      Current Outpatient Medications (Other):    metroNIDAZOLE (FLAGYL) 500 MG tablet, Take 1 tablet (500 mg total) by mouth 2 (two) times daily. (Patient not taking: Reported on 01/16/2022)   terconazole (TERAZOL 7) 0.4 % vaginal cream, Place 1 applicator vaginally at bedtime. (Patient not taking: Reported on 01/16/2022)  Medical History:  Past Medical History:  Diagnosis Date   External hemorrhoids    PROM (premature rupture of membranes) 10/06/2018   SVD (spontaneous vaginal delivery) 10/07/2018   Allergies:  Allergies  Allergen Reactions   Advil [Ibuprofen] Hives and Itching     Surgical History:  She  has a past surgical history that includes No past surgeries. Family History:  Her family history includes Asthma in her mother.  REVIEW OF SYSTEMS  : All other systems reviewed and negative except where noted in the History of Present Illness.  PHYSICAL EXAM: BP 100/80   Pulse (!) 105   Ht 5' 1.5" (1.562 m)   Wt 104 lb 2 oz (47.2 kg)   BMI 19.36 kg/m  General:   Pleasant, well developed female in no acute distress Head:   Normocephalic and atraumatic. Eyes:  sclerae anicteric,conjunctive pink  Heart:   regular rate and rhythm Pulm:  Clear anteriorly; no  wheezing Abdomen:   Soft, Flat AB, Active bowel sounds. No tenderness . Without guarding and Without rebound, No organomegaly appreciated. Rectal: Normal external rectal exam with prolapsed internal hemorrohoid, normal rectal tone, appreciated internal hemorrhoids, non-tender, no masses, , brown stool, hemoccult Negative Extremities:  Without edema. Msk: Symmetrical without gross deformities. Peripheral pulses intact.   Neurologic:  Alert and  oriented x4;  No focal deficits.  Skin:   Dry and intact without significant lesions or rashes. Psychiatric:  Cooperative. Normal mood and affect.    Doree Albee, PA-C 3:08 PM

## 2022-01-16 ENCOUNTER — Ambulatory Visit (INDEPENDENT_AMBULATORY_CARE_PROVIDER_SITE_OTHER): Payer: Medicaid Other | Admitting: Physician Assistant

## 2022-01-16 ENCOUNTER — Other Ambulatory Visit (INDEPENDENT_AMBULATORY_CARE_PROVIDER_SITE_OTHER): Payer: Medicaid Other

## 2022-01-16 ENCOUNTER — Encounter: Payer: Self-pay | Admitting: Physician Assistant

## 2022-01-16 VITALS — BP 100/80 | HR 105 | Ht 61.5 in | Wt 104.1 lb

## 2022-01-16 DIAGNOSIS — K648 Other hemorrhoids: Secondary | ICD-10-CM

## 2022-01-16 DIAGNOSIS — K5904 Chronic idiopathic constipation: Secondary | ICD-10-CM

## 2022-01-16 LAB — COMPREHENSIVE METABOLIC PANEL WITH GFR
ALT: 10 U/L (ref 0–35)
AST: 13 U/L (ref 0–37)
Albumin: 4.8 g/dL (ref 3.5–5.2)
Alkaline Phosphatase: 35 U/L — ABNORMAL LOW (ref 39–117)
BUN: 11 mg/dL (ref 6–23)
CO2: 25 meq/L (ref 19–32)
Calcium: 9.5 mg/dL (ref 8.4–10.5)
Chloride: 105 meq/L (ref 96–112)
Creatinine, Ser: 0.76 mg/dL (ref 0.40–1.20)
GFR: 107.47 mL/min
Glucose, Bld: 85 mg/dL (ref 70–99)
Potassium: 3.8 meq/L (ref 3.5–5.1)
Sodium: 138 meq/L (ref 135–145)
Total Bilirubin: 0.6 mg/dL (ref 0.2–1.2)
Total Protein: 7.8 g/dL (ref 6.0–8.3)

## 2022-01-16 LAB — CBC WITH DIFFERENTIAL/PLATELET
Basophils Absolute: 0.1 10*3/uL (ref 0.0–0.1)
Basophils Relative: 1.4 % (ref 0.0–3.0)
Eosinophils Absolute: 0.2 10*3/uL (ref 0.0–0.7)
Eosinophils Relative: 3.3 % (ref 0.0–5.0)
HCT: 42.8 % (ref 36.0–46.0)
Hemoglobin: 14 g/dL (ref 12.0–15.0)
Lymphocytes Relative: 34.8 % (ref 12.0–46.0)
Lymphs Abs: 2 10*3/uL (ref 0.7–4.0)
MCHC: 32.8 g/dL (ref 30.0–36.0)
MCV: 84.6 fl (ref 78.0–100.0)
Monocytes Absolute: 0.3 10*3/uL (ref 0.1–1.0)
Monocytes Relative: 5.3 % (ref 3.0–12.0)
Neutro Abs: 3.2 10*3/uL (ref 1.4–7.7)
Neutrophils Relative %: 55.2 % (ref 43.0–77.0)
Platelets: 215 10*3/uL (ref 150.0–400.0)
RBC: 5.05 Mil/uL (ref 3.87–5.11)
RDW: 13.7 % (ref 11.5–15.5)
WBC: 5.7 10*3/uL (ref 4.0–10.5)

## 2022-01-16 LAB — HIGH SENSITIVITY CRP: CRP, High Sensitivity: 0.23 mg/L (ref 0.000–5.000)

## 2022-01-16 LAB — SEDIMENTATION RATE: Sed Rate: 21 mm/h — ABNORMAL HIGH (ref 0–20)

## 2022-01-16 MED ORDER — HYDROCORTISONE (PERIANAL) 2.5 % EX CREA: 1.0000 | TOPICAL_CREAM | Freq: Two times a day (BID) | CUTANEOUS | 2 refills | Status: DC

## 2022-01-16 NOTE — Patient Instructions (Addendum)
You have been scheduled for a hemorrhoidal banding on 03/02/22 at 11:10 am with Dr Tomasa Rand.  Your provider has requested that you go to the basement level for lab work before leaving today. Press "B" on the elevator. The lab is located at the first door on the left as you exit the elevator.   Miralax is an osmotic laxative.  It only brings more water into the stool.  This is safe to take daily.  Can take up to 17 gram of miralax twice a day.  Mix with juice or coffee.  Start 1/2 capful at night for 3-4 days and reassess your response in 3-4 days.  You can increase and decrease the dose based on your response.  Remember, it can take up to 3-4 days to take effect OR for the effects to wear off.   I often pair this with benefiber/citracel in the morning to help assure the stool is not too loose.   Recommend increasing water and physical activity.   - Drink at least 64-80 ounces of water/liquid per day. - Establish a time to try to move your bowels every day.  For many people, this is after a cup of coffee or after a meal such as breakfast. - Sit all of the way back on the toilet keeping your back fairly straight and while sitting up, try to rest the tops of your forearms on your upper thighs.   - Raising your feet with a step stool/squatty potty can be helpful to improve the angle that allows your stool to pass through the rectum. - Relax the rectum feeling it bulge toward the toilet water.  If you feel your rectum raising toward your body, you are contracting rather than relaxing. - Breathe in and slowly exhale. "Belly breath" by expanding your belly towards your belly button. Keep belly expanded as you gently direct pressure down and back to the anus.  A low pitched GRRR sound can assist with increasing intra-abdominal pressure.  - Repeat 3-4 times. If unsuccessful, contract the pelvic floor to restore normal tone and get off the toilet.  Avoid excessive straining. - To reduce excessive  wiping by teaching your anus to normally contract, place hands on outer aspect of knees and resist knee movement outward.  Hold 5-10 second then place hands just inside of knees and resist inward movement of knees.  Hold 5 seconds.  Repeat a few times each way.  Go to the ER if unable to pass gas, severe AB pain, unable to hold down food, any shortness of breath of chest pain.  Please do sitz baths- these can be found at the pharmacy. It is a Chief Operating Officer that is put in your toliet.  Please increase fiber or add benefiber, increase water and increase acitivity.   Apply a pea size amount of Anusol HC cream that was sent into the pharmacy to the tip of an over the counter PrepH suppository and insert rectally once every night for at least 7 nights.  If this does not improve there are procedures that can be done.   About Hemorrhoids  Hemorrhoids are swollen veins in the lower rectum and anus.  Also called piles, hemorrhoids are a common problem.  Hemorrhoids may be internal (inside the rectum) or external (around the anus).  Internal Hemorrhoids  Internal hemorrhoids are often painless, but they rarely cause bleeding.  The internal veins may stretch and fall down (prolapse) through the anus to the outside of the body.  The  veins may then become irritated and painful.  External Hemorrhoids  External hemorrhoids can be easily seen or felt around the anal opening.  They are under the skin around the anus.  When the swollen veins are scratched or broken by straining, rubbing or wiping they sometimes bleed.  How Hemorrhoids Occur  Veins in the rectum and around the anus tend to swell under pressure.  Hemorrhoids can result from increased pressure in the veins of your anus or rectum.  Some sources of pressure are:  Straining to have a bowel movement because of constipation Waiting too long to have a bowel movement Coughing and sneezing often Sitting for extended periods of time, including on  the toilet Diarrhea Obesity Trauma or injury to the anus Some liver diseases Stress Family history of hemorrhoids Pregnancy  Pregnant women should try to avoid becoming constipated, because they are more likely to have hemorrhoids during pregnancy.  In the last trimester of pregnancy, the enlarged uterus may press on blood vessels and causes hemorrhoids.  In addition, the strain of childbirth sometimes causes hemorrhoids after the birth.  Symptoms of Hemorrhoids  Some symptoms of hemorrhoids include: Swelling and/or a tender lump around the anus Itching, mild burning and bleeding around the anus Painful bowel movements with or without constipation Bright red blood covering the stool, on toilet paper or in the toilet bowel.   Symptoms usually go away within a few days.  Always talk to your doctor about any bleeding to make sure it is not from some other causes.  Diagnosing and Treating Hemorrhoids  Diagnosis is made by an examination by your healthcare provider.  Special test can be performed by your doctor.    Most cases of hemorrhoids can be treated with: High-fiber diet: Eat more high-fiber foods, which help prevent constipation.  Ask for more detailed fiber information on types and sources of fiber from your healthcare provider. Fluids: Drink plenty of water.  This helps soften bowel movements so they are easier to pass. Sitz baths and cold packs: Sitting in lukewarm water two or three times a day for 15 minutes cleases the anal area and may relieve discomfort.  If the water is too hot, swelling around the anus will get worse.  Placing a cloth-covered ice pack on the anus for ten minutes four times a day can also help reduce selling.  Gently pushing a prolapsed hemorrhoid back inside after the bath or ice pack can be helpful. Medications: For mild discomfort, your healthcare provider may suggest over-the-counter pain medication or prescribe a cream or ointment for topical use.  The  cream may contain witch hazel, zinc oxide or petroleum jelly.  Medicated suppositories are also a treatment option.  Always consult your doctor before applying medications or creams. Procedures and surgeries: There are also a number of procedures and surgeries to shrink or remove hemorrhoids in more serious cases.  Talk to your physician about these options.  You can often prevent hemorrhoids or keep them from becoming worse by maintaining a healthy lifestyle.  Eat a fiber-rich diet of fruits, vegetables and whole grains.  Also, drink plenty of water and exercise regularly.   2007, [employer]

## 2022-01-18 LAB — TISSUE TRANSGLUTAMINASE, IGA: (tTG) Ab, IgA: <1 U/mL

## 2022-01-18 LAB — IGA: Immunoglobulin A: 93 mg/dL (ref 47–310)

## 2022-01-20 NOTE — Progress Notes (Signed)
Agree with the assessment and plan as outlined by Amanda Collier,  PA-C.  Daxten Kovalenko E. Toren Tucholski, MD  

## 2022-01-31 ENCOUNTER — Other Ambulatory Visit: Payer: Self-pay

## 2022-01-31 ENCOUNTER — Ambulatory Visit (INDEPENDENT_AMBULATORY_CARE_PROVIDER_SITE_OTHER): Payer: Medicaid Other

## 2022-01-31 VITALS — BP 115/81 | HR 88 | Wt 106.7 lb

## 2022-01-31 DIAGNOSIS — Z3042 Encounter for surveillance of injectable contraceptive: Secondary | ICD-10-CM | POA: Diagnosis not present

## 2022-01-31 MED ORDER — MEDROXYPROGESTERONE ACETATE 150 MG/ML IM SUSP
150.0000 mg | Freq: Once | INTRAMUSCULAR | Status: AC
  Administered 2022-01-31: 150 mg via INTRAMUSCULAR

## 2022-01-31 NOTE — Progress Notes (Signed)
Kari Schmidt here for Depo-Provera Injection. Pt continues to consider changing birth control method to OCPs or d/c birth control completely. Reviewed available options with patient. Pt prefers to continue Depo at this time. Injection administered without complication. Patient will return in 3 months for next injection between 04/18/22 and 05/03/22. Next annual visit due June 2024.   Marjo Bicker, RN 01/31/2022  8:23 AM

## 2022-03-02 ENCOUNTER — Ambulatory Visit (INDEPENDENT_AMBULATORY_CARE_PROVIDER_SITE_OTHER): Payer: Medicaid Other | Admitting: Gastroenterology

## 2022-03-02 ENCOUNTER — Encounter: Payer: Self-pay | Admitting: Gastroenterology

## 2022-03-02 VITALS — BP 118/70 | HR 84 | Ht 61.5 in | Wt 106.8 lb

## 2022-03-02 DIAGNOSIS — K649 Unspecified hemorrhoids: Secondary | ICD-10-CM

## 2022-03-02 NOTE — Patient Instructions (Signed)
_______________________________________________________  If you are age 28 or older, your body mass index should be between 23-30. Your Body mass index is 19.85 kg/m. If this is out of the aforementioned range listed, please consider follow up with your Primary Care Provider.  If you are age 25 or younger, your body mass index should be between 19-25. Your Body mass index is 19.85 kg/m. If this is out of the aformentioned range listed, please consider follow up with your Primary Care Provider.   ________________________________________________________  The [place] [facility] providers would like to encourage you to use [facility] to communicate with providers for non-urgent requests or questions.  Due to long hold times on the telephone, sending your provider a message by [facility] may be a faster and more efficient way to get a response.  Please allow 48 business hours for a response.  Please remember that this is for non-urgent requests.  _______________________________________________________  Kari Schmidt PROCEDURE    FOLLOW-UP CARE   The procedure you have had should have been relatively painless since the banding of the area involved does not have nerve endings and there is no pain sensation.  The rubber band cuts off the blood supply to the hemorrhoid and the band may fall off as soon as 48 hours after the banding (the band may occasionally be seen in the toilet bowl following a bowel movement). You may notice a temporary feeling of fullness in the rectum which should respond adequately to plain Tylenol or Motrin.  Following the banding, avoid strenuous exercise that evening and resume full activity the next day.  A sitz bath (soaking in a warm tub) or bidet is soothing, and can be useful for cleansing the area after bowel movements.     To avoid constipation, take two tablespoons of natural wheat bran, natural oat bran, flax, Benefiber or any over the counter fiber supplement and increase  your water intake to 7-8 glasses daily.    Unless you have been prescribed anorectal medication, do not put anything inside your rectum for two weeks: No suppositories, enemas, fingers, etc.  Occasionally, you may have more bleeding than usual after the banding procedure.  This is often from the untreated hemorrhoids rather than the treated one.  Don't be concerned if there is a tablespoon or so of blood.  If there is more blood than this, lie flat with your bottom higher than your head and apply an ice pack to the area. If the bleeding does not stop within a half an hour or if you feel faint, call our office at [phone] or go to the emergency room.  Problems are not common; however, if there is a substantial amount of bleeding, severe pain, chills, fever or difficulty passing urine (very rare) or other problems, you should call us at [phone] or report to the nearest emergency room.  Do not stay seated continuously for more than 2-3 hours for a day or two after the procedure.  Tighten your buttock muscles 10-15 times every two hours and take 10-15 deep breaths every 1-2 hours.  Do not spend more than a few minutes on the toilet if you cannot empty your bowel; instead re-visit the toilet at a later time.

## 2022-03-02 NOTE — Progress Notes (Signed)
Kari Schmidt presents to our clinic today for initial hemorrhoid banding.  She was seen by Vicie Mutters on January 16, 2022 with complaints of chronic constipation and chronic prolapsing internal hemorrhoids.  She was recommended to increase dietary fiber, water intake and use hydrocortisone suppositories and creams as well as sitz bath's. The patient reports that these interventions have not significantly improved her symptoms. She continues to be bothered by perianal swelling, bleeding itching and prolapse symptoms.  She desires hemorrhoid banding.    PROCEDURE NOTE: The patient presents with symptomatic grade 3 hemorrhoids, requesting rubber band ligation of his/her hemorrhoidal disease.  All risks, benefits and alternative forms of therapy were described and informed consent was obtained.   The anorectum was pre-medicated with topical lidocaine (5%) and nitroglycerin (0.125%) Perianal exam notable for fleshy skin tag/small external hemorrhoid.  No obvious prolapsed hemorrhoid.  No anal fissure.  Digital rectal exam unremarkable. The decision was made to band the left lateral internal hemorrhoid, and the [place] was used to perform band ligation without complication.  Digital anorectal examination was then performed to assure proper positioning of the band, and to adjust the banded tissue as required.  The patient was discharged home without pain or other issues.  Dietary and behavioral recommendations were given and along with follow-up instructions.     The following adjunctive treatments were recommended:  Fiber supplementation Adequate water intake Avoidance of straining and hard stools  The patient will return in 2-4 weeks for  follow-up and possible additional banding as required. No complications were encountered and the patient tolerated the procedure well.

## 2022-03-06 ENCOUNTER — Telehealth: Payer: Self-pay | Admitting: Gastroenterology

## 2022-03-06 ENCOUNTER — Telehealth: Payer: Self-pay | Admitting: Physician Assistant

## 2022-03-06 NOTE — Telephone Encounter (Signed)
Pt states she came in for a banding last week but she did not understand because she still has a hemorrhoid hanging down. Discussed with pt that she had an internal hem banded and is to come back in 2-4 weeks to have another internal one banded. Let her know that external hems are handled by surgeons. Pt verbalized understanding.

## 2022-03-06 NOTE — Telephone Encounter (Signed)
Patient called states she has two hemorrhoids and came in last week for her first banding procedure however she is questioning what exactly was done on that day because she still has the hemorrhoids. Please advise.

## 2022-04-04 ENCOUNTER — Encounter: Payer: Medicaid Other | Admitting: Gastroenterology

## 2022-04-18 ENCOUNTER — Ambulatory Visit: Payer: Medicaid Other

## 2022-06-16 ENCOUNTER — Encounter: Payer: Self-pay | Admitting: Certified Nurse Midwife

## 2022-09-12 ENCOUNTER — Telehealth: Payer: Self-pay | Admitting: Family Medicine

## 2022-09-12 DIAGNOSIS — B379 Candidiasis, unspecified: Secondary | ICD-10-CM

## 2022-09-12 NOTE — Telephone Encounter (Signed)
Patient called in saying she was itchy in vaginal area and has swelling. She will like to speak with a nurse an d get a cream or something to help

## 2022-09-13 MED ORDER — TERCONAZOLE 0.4 % VA CREA: 1.0000 | TOPICAL_CREAM | Freq: Every day | VAGINAL | 0 refills | Status: DC

## 2022-09-13 NOTE — Telephone Encounter (Signed)
Patient called back into front office reporting labial swelling and increased creamy vaginal discharge. She denies itching at this time. Discussed terazol sent to pharmacy for yeast infection. Recommended she apply it on the inside and outside where she is swollen. Advised she call us back in 2-3 days if she hasn't seen improvement. Patient verbalized understanding.

## 2022-10-03 ENCOUNTER — Other Ambulatory Visit (HOSPITAL_COMMUNITY)
Admission: RE | Admit: 2022-10-03 | Discharge: 2022-10-03 | Disposition: A | Discharge status: Home / Self Care | Payer: Medicaid Other | Source: Ambulatory Visit | Attending: Family Medicine | Admitting: Family Medicine

## 2022-10-03 ENCOUNTER — Ambulatory Visit (INDEPENDENT_AMBULATORY_CARE_PROVIDER_SITE_OTHER): Payer: Medicaid Other | Admitting: General Practice

## 2022-10-03 ENCOUNTER — Other Ambulatory Visit: Payer: Self-pay

## 2022-10-03 VITALS — BP 95/54 | HR 78 | Ht 61.0 in | Wt 106.0 lb

## 2022-10-03 DIAGNOSIS — N898 Other specified noninflammatory disorders of vagina: Secondary | ICD-10-CM | POA: Diagnosis present

## 2022-10-03 MED ORDER — FLUCONAZOLE 150 MG PO TABS: 150.0000 mg | ORAL_TABLET | Freq: Once | ORAL | 0 refills | Status: AC

## 2022-10-03 NOTE — Progress Notes (Signed)
Patient presents to office today for self swab. She reports yellow/green discharge a couple weeks ago with itching and labial swelling- tried 7 day course of terazol. She isn't sure if things improved because she started her period right after. Since her period she has reported ongoing vaginal itching & green discharge that started yesterday. She denies odor or pelvic pain. Patient has had BV & yeast in the past but feels this is different. Patient instructed in self swab and specimen collected. Discussed results will be back in 24-48 hours and available via mychart. Patient will also return to office for annual exam.  Chase Caller RN BSN 10/03/22

## 2022-11-16 ENCOUNTER — Other Ambulatory Visit: Payer: Self-pay

## 2022-11-16 ENCOUNTER — Ambulatory Visit (INDEPENDENT_AMBULATORY_CARE_PROVIDER_SITE_OTHER): Payer: Medicaid Other | Admitting: Obstetrics and Gynecology

## 2022-11-16 ENCOUNTER — Other Ambulatory Visit (HOSPITAL_COMMUNITY)
Admission: RE | Admit: 2022-11-16 | Discharge: 2022-11-16 | Disposition: A | Discharge status: Home / Self Care | Payer: Medicaid Other | Source: Ambulatory Visit | Attending: Obstetrics and Gynecology | Admitting: Obstetrics and Gynecology

## 2022-11-16 VITALS — BP 120/86 | HR 92 | Wt 106.0 lb

## 2022-11-16 DIAGNOSIS — Z113 Encounter for screening for infections with a predominantly sexual mode of transmission: Secondary | ICD-10-CM

## 2022-11-16 DIAGNOSIS — Z01419 Encounter for gynecological examination (general) (routine) without abnormal findings: Secondary | ICD-10-CM

## 2022-11-16 DIAGNOSIS — Z3009 Encounter for other general counseling and advice on contraception: Secondary | ICD-10-CM

## 2022-11-16 DIAGNOSIS — Z3042 Encounter for surveillance of injectable contraceptive: Secondary | ICD-10-CM | POA: Diagnosis not present

## 2022-11-16 LAB — POCT PREGNANCY, URINE: Preg Test, Ur: NEGATIVE

## 2022-11-16 MED ORDER — MEDROXYPROGESTERONE ACETATE 150 MG/ML IM SUSP
150.0000 mg | Freq: Once | INTRAMUSCULAR | Status: AC
  Administered 2022-11-16: 150 mg via INTRAMUSCULAR

## 2022-11-16 NOTE — Progress Notes (Signed)
ANNUAL EXAM Patient name: Kari Schmidt MRN [id number]  Date of birth: Mar 12, 1995 Chief Complaint:   Gynecologic Exam  History of Present Illness:   Kari Schmidt is a 27 y.o. G2P2002 being seen today for a routine annual exam.  Current complaints: annual  Menstrual concerns? No   Breast or nipple changes? No  Contraception use? No  Sexually active? No   Wold like to resume depo  Previously had a nurse visit for discharge. Took medication that helped (given pill and cream) and it went away. Wanted a swab today to be sure it has cleared  Patient's last menstrual period was 10/19/2022 (exact date).   The pregnancy intention screening data noted above was reviewed. Potential methods of contraception were discussed. The patient elected to proceed with No data recorded.   Last pap 03/16/2020 NILM   H/O abnormal pap: no Last mammogram: n/a.  Last colonoscopy: n/a.      11/16/2022    3:46 PM 10/11/2021    9:29 AM 09/07/2021    3:12 PM 08/24/2021    2:06 PM 07/11/2021    3:09 PM  Depression screen PHQ 2/9  Decreased Interest 0 0 0 0 0  Down, Depressed, Hopeless 0 0 0 0 0  PHQ - 2 Score 0 0 0 0 0  Altered sleeping 0 0 0 0 0  Tired, decreased energy 0 0 0 2 3  Change in appetite 0 0 0 0 0  Feeling bad or failure about yourself  0 0 0 0 0  Trouble concentrating 0 0 0 0 0  Moving slowly or fidgety/restless 0 0 0 0 0  Suicidal thoughts 0 0 0 0 0  PHQ-9 Score 0 0 0 2 3  Difficult doing work/chores     Somewhat difficult        11/16/2022    3:46 PM 10/11/2021    9:29 AM 09/07/2021    3:12 PM 08/24/2021    2:07 PM  GAD 7 : Generalized Anxiety Score  Nervous, Anxious, on Edge 0 0 0 0  Control/stop worrying 0 0 0 0  Worry too much - different things 0 0 0 0  Trouble relaxing 0 0 0 0  Restless 0 0 0 0  Easily annoyed or irritable 0 0 0 0  Afraid - awful might happen 0 0 0 0  Total GAD 7 Score 0 0 0 0     Review of Systems:   Pertinent items are noted in HPI Denies any headaches,  blurred vision, fatigue, shortness of breath, chest pain, abdominal pain, abnormal vaginal discharge/itching/odor/irritation, problems with periods, bowel movements, urination, or intercourse unless otherwise stated above. Pertinent History Reviewed:  Reviewed past medical,surgical, social and family history.  Reviewed problem list, medications and allergies. Physical Assessment:   Vitals:   11/16/22 1522  BP: 120/86  Pulse: 92  Weight: 106 lb (48.1 kg)  Body mass index is 20.03 kg/m.        Physical Examination:   General appearance - well appearing, and in no distress  Mental status - alert, oriented to person, place, and time  Psych:  She has a normal mood and affect  Skin - warm and dry, normal color, no suspicious lesions noted  Chest - effort normal, all lung fields clear to auscultation bilaterally  Heart - normal rate and regular rhythm  Breasts - breasts appear normal, no suspicious masses, no skin or nipple changes or  axillary nodes  Abdomen - soft, nontender, nondistended, no  masses or organomegaly  Pelvic -  VULVA: normal appearing vulva with no masses, tenderness or lesions   VAGINA: normal appearing vagina with normal color and discharge, no lesions    Extremities:  No swelling or varicosities noted  Chaperone present for exam  Results for orders placed or performed in visit on 11/16/22 (from the past 24 hour(s))  Pregnancy, urine POC   Collection Time: 11/16/22  3:37 PM  Result Value Ref Range   Preg Test, Ur NEGATIVE NEGATIVE      Assessment & Plan:  1. Screening for STD (sexually transmitted disease) - Cervicovaginal ancillary only  2. Well woman exam with routine gynecological exam - Cervical cancer screening: Discussed screening Q3 years. Reviewed importance of annual exams and limits of pap smear. Pap with reflex HPV due 2025 - GC/CT: Discussed and recommended. Pt  accepts - Gardasil: completed - Birth Control: Depo - Breast Health: Encouraged self  breast awareness/exams.  - Follow-up: 12 months and prn  - Pregnancy, urine POC  3. Encounter for counseling regarding contraception Negative UPT today, will resume depo  - medroxyPROGESTERone (DEPO-PROVERA) injection 150 mg - Pregnancy, urine POC     Orders Placed This Encounter  Procedures   Pregnancy, urine POC    Meds:  Meds ordered this encounter  Medications   medroxyPROGESTERone (DEPO-PROVERA) injection 150 mg    Follow-up: No follow-ups on file.  Lorriane Shire, MD 11/16/2022 3:57 PM

## 2022-11-17 LAB — CERVICOVAGINAL ANCILLARY ONLY
Bacterial Vaginitis (gardnerella): NEGATIVE
Candida Glabrata: NEGATIVE
Candida Vaginitis: NEGATIVE
Chlamydia: NEGATIVE
Comment: NEGATIVE
Comment: NEGATIVE
Comment: NEGATIVE
Comment: NEGATIVE
Comment: NEGATIVE
Comment: NORMAL
Neisseria Gonorrhea: NEGATIVE
Trichomonas: NEGATIVE

## 2023-02-01 ENCOUNTER — Ambulatory Visit: Payer: Medicaid Other

## 2023-02-01 ENCOUNTER — Other Ambulatory Visit: Payer: Self-pay

## 2023-02-01 VITALS — BP 104/70 | HR 89 | Ht 61.0 in | Wt 108.0 lb

## 2023-02-01 DIAGNOSIS — Z3042 Encounter for surveillance of injectable contraceptive: Secondary | ICD-10-CM

## 2023-02-01 MED ORDER — MEDROXYPROGESTERONE ACETATE 150 MG/ML IM SUSY
150.0000 mg | PREFILLED_SYRINGE | Freq: Once | INTRAMUSCULAR | Status: AC
  Administered 2023-02-01: 150 mg via INTRAMUSCULAR

## 2023-02-01 NOTE — Progress Notes (Signed)
Kari Schmidt here for Depo-Provera Injection. Injection administered without complication. Patient will return in 3 months for next injection between 04/19/23 and 3/5. Next annual visit due 10/2023. Next pap smear is due 02/2023. Patient will schedule pap smear and next depo injection at checkout.  Quintella Reichert, RN 02/01/2023  3:30 PM

## 2023-03-22 IMAGING — US US MFM FETAL BPP W/O NON-STRESS
1 series · 14 of 28 positions shown · non-contrast
Comparison: none

[Series 1: us mfm fetal bpp w/o non-stress · 32 acquisitions, 14 frames shown]
[im 2/32]
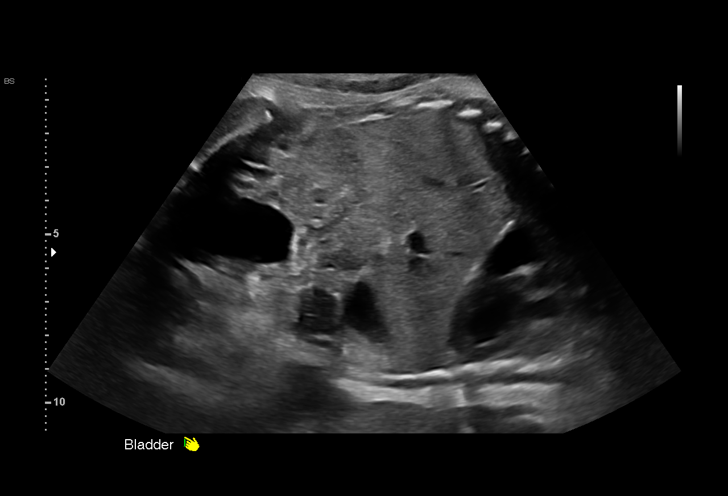
[im 4/32]
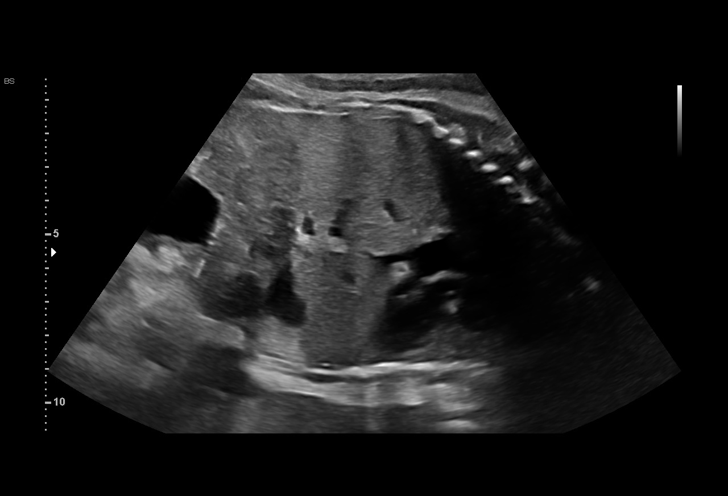
[im 6/32]
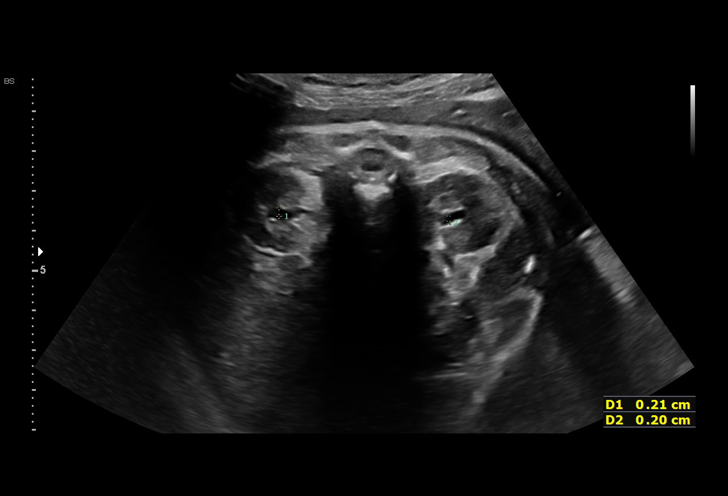
[im 9/32]
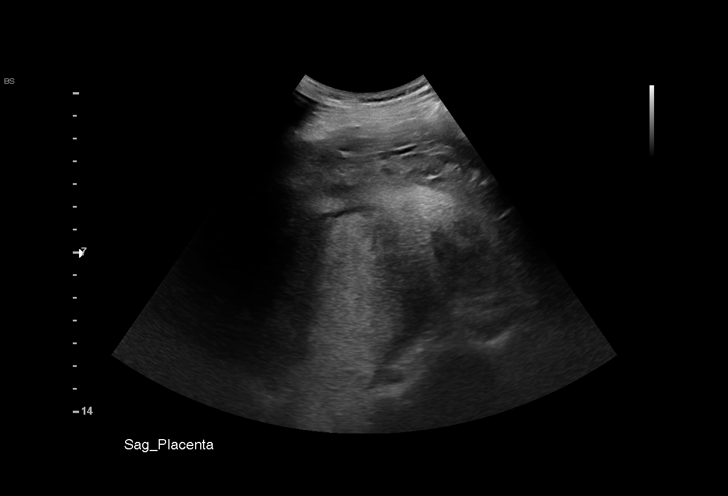
[im 11/32]
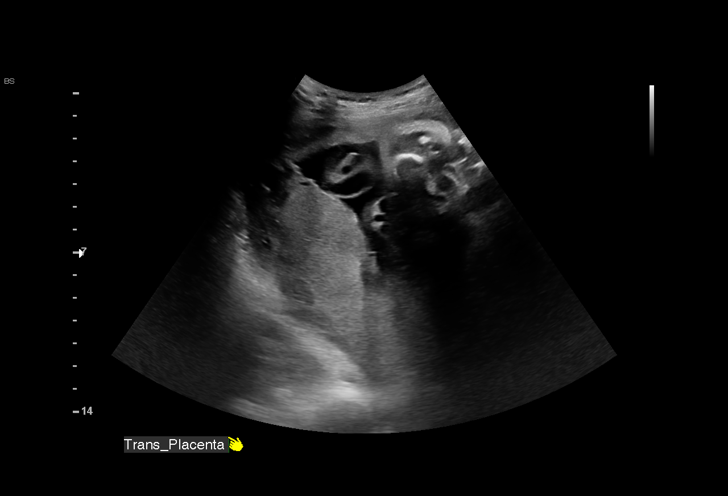
[im 13/32]
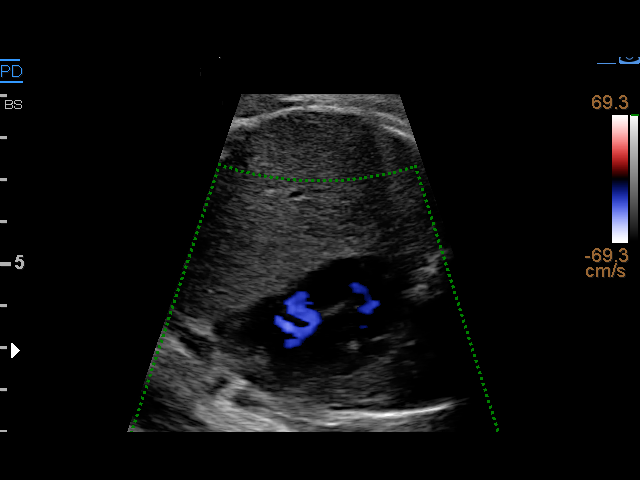
[im 15/32]
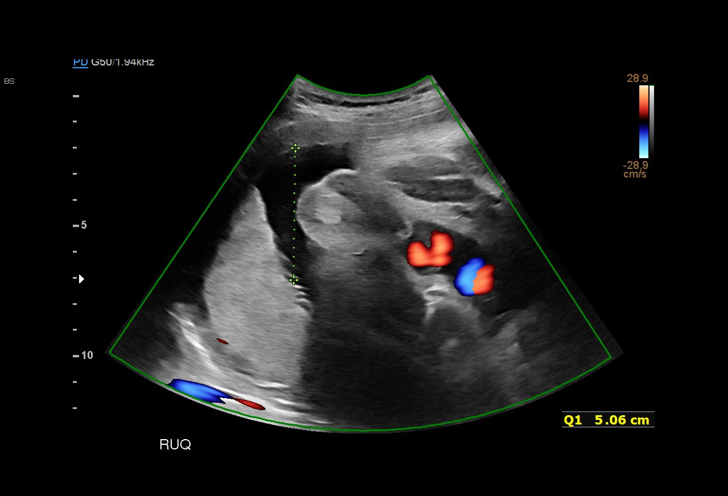
[im 18/32]
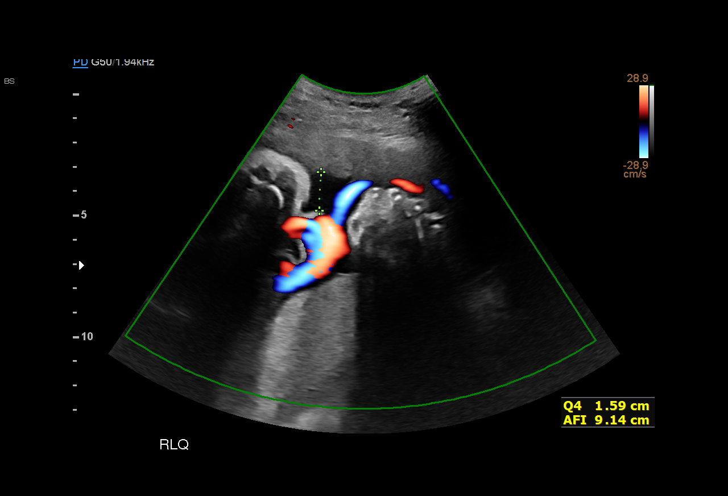
[im 20/32]
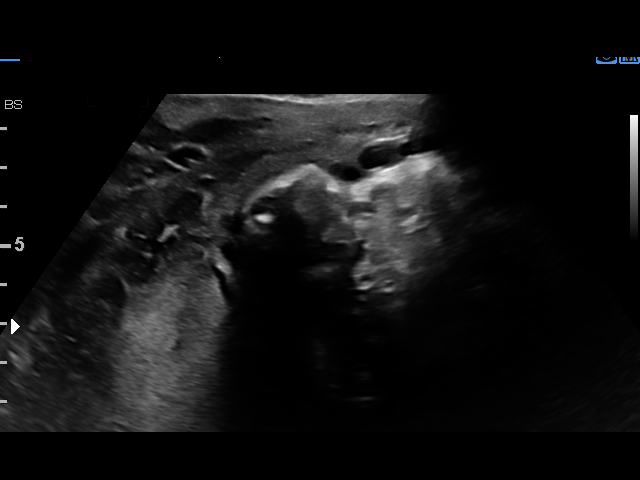
[im 22/32]
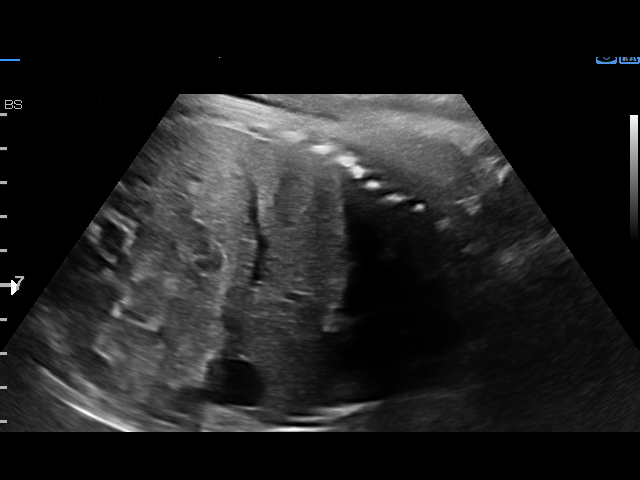
[im 25/32]
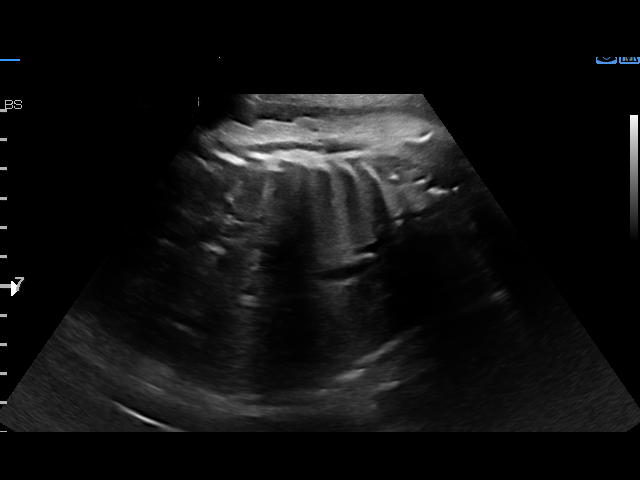
[im 27/32]
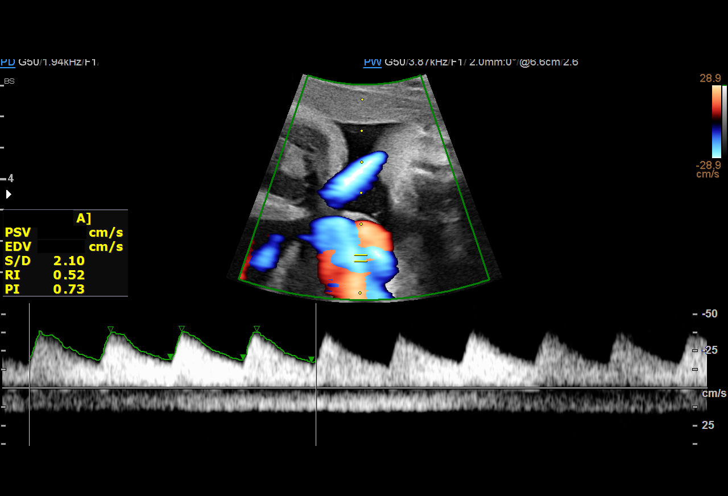
[im 29/32]
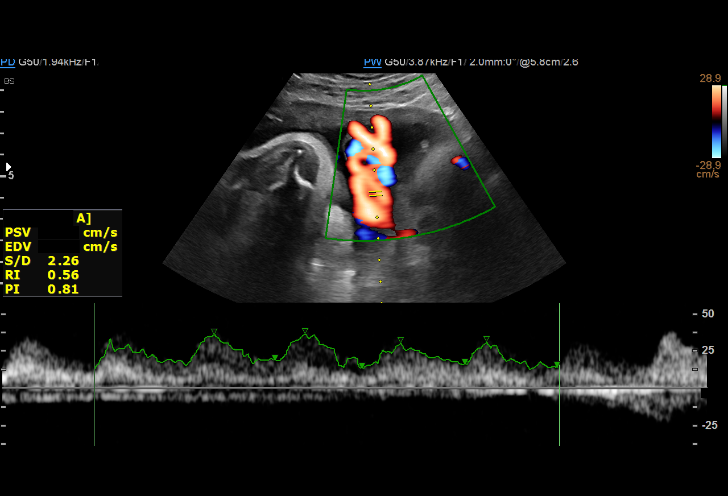
[im 32/32]
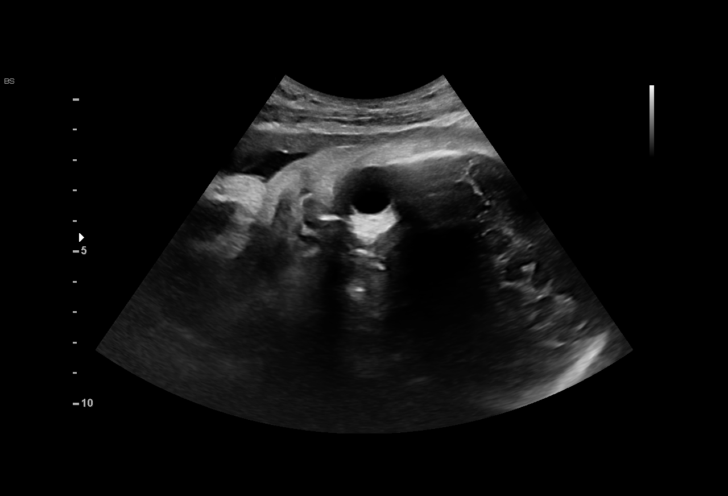

[14 of 28 positions shown; findings below may reference images not displayed]

[REDACTED]care
                   WALKER CNM

Indications

 Maternal care for known or suspected poor
 fetal growth, third trimester, fetus 1 IUGR
 Uterine size-date discrepancy, third trimester
 Late to prenatal care, third trimester
 Maternal thalassemia complicating
 pregnancy in third trimester (silent carrier)
 Encounter for antenatal screening for other
 genetic defects
 CF negative
 Negative Quad Screen
 37 weeks gestation of pregnancy
Fetal Evaluation

 Num Of Fetuses:         1
 Fetal Heart Rate(bpm):  133
 Cardiac Activity:       Observed
 Presentation:           Cephalic
 Placenta:               Posterior
 P. Cord Insertion:      Previously Visualized

 Amniotic Fluid
 AFI FV:      Within normal limits

 AFI Sum(cm)     %Tile       Largest Pocket(cm)
 9.14            19
 RUQ(cm)       RLQ(cm)       LUQ(cm)        LLQ(cm)

Biophysical Evaluation

 Amniotic F.V:   Pocket => 2 cm             F. Tone:        Observed
 F. Movement:    Observed                   Score:          [DATE]
 F. Breathing:   Observed
Biometry

 LV:        2.4  mm
OB History

 Gravidity:    2         Term:   1
 Living:       1
Gestational Age

 LMP:           37w 5d        Date:  10/29/19                 EDD:   08/04/20
 Best:          37w 5d     Det. By:  LMP  (10/29/19)          EDD:   08/04/20
Anatomy

 Cranium:               Previously seen        LVOT:                   Previously seen
 Cavum:                 Previously seen        Aortic Arch:            Previously seen
 Ventricles:            Appears normal         Ductal Arch:            Previously seen
 Choroid Plexus:        Previously seen        Diaphragm:              Appears normal
 Cerebellum:            Previously seen        Stomach:                Appears normal, left
                                                                       sided
 Posterior Fossa:       Previously seen        Abdomen:                Appears normal
 Nuchal Fold:           Not applicable (>20    Abdominal Wall:         Not well visualized
                        wks GA)
 Face:                  Orbits nl; profile     Cord Vessels:           Previously seen
                        limited
 Lips:                  Not well visualized    Kidneys:                Appear normal
 Palate:                Not well visualized    Bladder:                Appears normal
 Thoracic:              Previously seen        Spine:                  Previously seen
 Heart:                 Previously seen        Upper Extremities:      Not well visualized
 RVOT:                  Previously seen        Lower Extremities:      Not well visualized

 Other:  Technically difficult due to advanced gestational age.
Doppler - Fetal Vessels

 Umbilical Artery
  S/D     %tile      RI    %tile      PI    %tile     PSV    ADFV    RDFV
                                                    (cm/s)
   2.1       35    0.52       34    0.[REDACTED]      No      No

Cervix Uterus Adnexa

 Cervix
 Not visualized (advanced GA >37wks)
Impression

 Fetal growth restriction.  On ultrasound performed 2 weeks
 ago, the estimated fetal weight was at the 7th percentile.
 Blood pressure today at her office is 110/73 mmHg.
 Amniotic fluid is normal and good fetal activity seen.
 Antenatal testing is reassuring.  BPP [DATE].  Cephalic
 presentation. Umbilical artery Doppler showed normal
 forward diastolic flow.

 Patient would like to deliver at 39 weeks gestation.
Recommendations

 -No follow-up appointments were ma[REDACTED]

## 2023-04-25 ENCOUNTER — Other Ambulatory Visit: Payer: Self-pay

## 2023-04-25 ENCOUNTER — Ambulatory Visit (INDEPENDENT_AMBULATORY_CARE_PROVIDER_SITE_OTHER): Payer: Medicaid Other | Admitting: *Deleted

## 2023-04-25 VITALS — BP 110/76 | HR 98 | Ht 61.5 in | Wt 105.4 lb

## 2023-04-25 DIAGNOSIS — Z3042 Encounter for surveillance of injectable contraceptive: Secondary | ICD-10-CM | POA: Diagnosis not present

## 2023-04-25 MED ORDER — MEDROXYPROGESTERONE ACETATE 150 MG/ML IM SUSP
150.0000 mg | Freq: Once | INTRAMUSCULAR | Status: AC
  Administered 2023-04-25: 150 mg via INTRAMUSCULAR

## 2023-04-25 NOTE — Progress Notes (Signed)
 Depo provera 150 mg IM administered as scheduled.  Pt tolerated well. Next injection due 5/14-5/28.  Pt is due for Annual exam w/pap after 11/16/23.  She voiced understanding.

## 2023-05-09 ENCOUNTER — Encounter (HOSPITAL_COMMUNITY): Payer: Self-pay | Admitting: *Deleted

## 2023-05-09 ENCOUNTER — Emergency Department (HOSPITAL_COMMUNITY)
Admission: EM | Admit: 2023-05-09 | Discharge: 2023-05-10 | Disposition: A | Discharge status: Home / Self Care | Attending: Emergency Medicine | Admitting: Emergency Medicine

## 2023-05-09 ENCOUNTER — Other Ambulatory Visit: Payer: Self-pay

## 2023-05-09 ENCOUNTER — Emergency Department (HOSPITAL_COMMUNITY)

## 2023-05-09 DIAGNOSIS — R1012 Left upper quadrant pain: Secondary | ICD-10-CM | POA: Insufficient documentation

## 2023-05-09 DIAGNOSIS — R059 Cough, unspecified: Secondary | ICD-10-CM | POA: Diagnosis not present

## 2023-05-09 DIAGNOSIS — R109 Unspecified abdominal pain: Secondary | ICD-10-CM

## 2023-05-09 DIAGNOSIS — R067 Sneezing: Secondary | ICD-10-CM | POA: Insufficient documentation

## 2023-05-09 LAB — COMPREHENSIVE METABOLIC PANEL
ALT: 9 U/L (ref 0–44)
AST: 15 U/L (ref 15–41)
Albumin: 3.8 g/dL (ref 3.5–5.0)
Alkaline Phosphatase: 44 U/L (ref 38–126)
Anion gap: 11 (ref 5–15)
BUN: 7 mg/dL (ref 6–20)
CO2: 24 mmol/L (ref 22–32)
Calcium: 9 mg/dL (ref 8.9–10.3)
Chloride: 106 mmol/L (ref 98–111)
Creatinine, Ser: 0.64 mg/dL (ref 0.44–1.00)
GFR, Estimated: >60 mL/min (ref >60)
Glucose, Bld: 93 mg/dL (ref 70–99)
Potassium: 3.5 mmol/L (ref 3.5–5.1)
Sodium: 141 mmol/L (ref 135–145)
Total Bilirubin: 0.6 mg/dL (ref 0.0–1.2)
Total Protein: 7.4 g/dL (ref 6.5–8.1)

## 2023-05-09 LAB — URINALYSIS, W/ REFLEX TO CULTURE (INFECTION SUSPECTED)
Bilirubin Urine: NEGATIVE
Glucose, UA: >=500 mg/dL — AB
Hgb urine dipstick: NEGATIVE
Ketones, ur: NEGATIVE mg/dL
Nitrite: NEGATIVE
Protein, ur: NEGATIVE mg/dL
Specific Gravity, Urine: 1.016 (ref 1.005–1.030)
pH: 7 (ref 5.0–8.0)

## 2023-05-09 LAB — CBC WITH DIFFERENTIAL/PLATELET
Abs Immature Granulocytes: 0 10*3/uL (ref 0.00–0.07)
Basophils Absolute: 0.1 10*3/uL (ref 0.0–0.1)
Basophils Relative: 1 %
Eosinophils Absolute: 0.1 10*3/uL (ref 0.0–0.5)
Eosinophils Relative: 2 %
HCT: 41.6 % (ref 36.0–46.0)
Hemoglobin: 13.4 g/dL (ref 12.0–15.0)
Immature Granulocytes: 0 %
Lymphocytes Relative: 30 %
Lymphs Abs: 1.9 10*3/uL (ref 0.7–4.0)
MCH: 27.4 pg (ref 26.0–34.0)
MCHC: 32.2 g/dL (ref 30.0–36.0)
MCV: 85.1 fL (ref 80.0–100.0)
Monocytes Absolute: 0.6 10*3/uL (ref 0.1–1.0)
Monocytes Relative: 10 %
Neutro Abs: 3.7 10*3/uL (ref 1.7–7.7)
Neutrophils Relative %: 57 %
Platelets: 244 10*3/uL (ref 150–400)
RBC: 4.89 MIL/uL (ref 3.87–5.11)
RDW: 14.4 % (ref 11.5–15.5)
WBC: 6.4 10*3/uL (ref 4.0–10.5)
nRBC: 0 % (ref 0.0–0.2)

## 2023-05-09 LAB — HCG, QUANTITATIVE, PREGNANCY: hCG, Beta Chain, Quant, S: <1 m[IU]/mL (ref <5)

## 2023-05-09 LAB — TROPONIN I (HIGH SENSITIVITY): Troponin I (High Sensitivity): <2 ng/L (ref <18)

## 2023-05-09 LAB — LIPASE, BLOOD: Lipase: 28 U/L (ref 11–51)

## 2023-05-09 MED ORDER — IOHEXOL 350 MG/ML SOLN
75.0000 mL | Freq: Once | INTRAVENOUS | Status: AC | PRN
  Administered 2023-05-09: 75 mL via INTRAVENOUS

## 2023-05-09 MED ORDER — ACETAMINOPHEN 500 MG PO TABS
1000.0000 mg | ORAL_TABLET | Freq: Once | ORAL | Status: AC
  Administered 2023-05-09: 1000 mg via ORAL
  Filled 2023-05-09: qty 2

## 2023-05-09 NOTE — ED Triage Notes (Addendum)
 Pt is here for LUQ abdominal pain which began yesterday.  This pain is today radiating to left neck.  No CP No n/v, no fever.

## 2023-05-09 NOTE — ED Provider Triage Note (Signed)
 Emergency Medicine Provider Triage Evaluation Note  Kari Schmidt , a 28 y.o. female  was evaluated in triage.  Pt complains of LUQ pain.  Review of Systems  Positive:  Negative:   Physical Exam  BP 110/80 (BP Location: Right Arm)   Pulse 90   Temp 98.2 F (36.8 C)   Resp 18   SpO2 100%  Gen:   Awake, no distress   Resp:  Normal effort  MSK:   Moves extremities without difficulty  Other:    Medical Decision Making  Medically screening exam initiated at 8:14 PM.  Appropriate orders placed.  Kari Schmidt was informed that the remainder of the evaluation will be completed by another provider, this initial triage assessment does not replace that evaluation, and the importance of remaining in the ED until their evaluation is complete.  LUQ pain starting yesterday. Patient noticing "pain/pulsing" in the left side of her neck today as well. No chest pain, cough, SOB, nausea, vomiting ,diarrhea. No tenderness to palpation right now. Patient stating that it hurts to move.   Dorthy Cooler, [place] 05/09/23 2015

## 2023-05-10 NOTE — ED Provider Notes (Signed)
 [place] EMERGENCY DEPARTMENT AT [facility] Provider Note   CSN: [id number] Arrival date & time: 05/09/23  1959     History  Chief Complaint  Patient presents with   Abdominal Pain    Kari Schmidt is a 28 y.o. female with no reported past medical history presents the emergency department complaining of abdominal pain for the past 2 days.  Localizes pain to the left upper quadrant, describes it as a "pulsating" pain that radiates intermittently up to her neck.  It is worse with movement, cough, and sneezing.  No alleviating factors, however she does state that she received medication while in triage and her pain feels significantly improved now.  She has never had pain like this before.  Has never had surgery on her abdomen before.  States that she had a respiratory illness with cough that resolved at the beginning of last month.  She denies fevers, chills, nausea, vomiting, diarrhea, urinary symptoms, vaginal bleeding or discharge.   Abdominal Pain      Home Medications Prior to Admission medications   Not on File      Allergies    Advil [ibuprofen]    Review of Systems   Review of Systems  Gastrointestinal:  Positive for abdominal pain.  All other systems reviewed and are negative.   Physical Exam Updated Vital Signs BP 110/70   Pulse 91   Temp 98.4 F (36.9 C)   Resp 18   SpO2 100%  Physical Exam Vitals and nursing note reviewed.  Constitutional:      Appearance: Normal appearance.  HENT:     Head: Normocephalic and atraumatic.  Eyes:     Conjunctiva/sclera: Conjunctivae normal.  Cardiovascular:     Rate and Rhythm: Normal rate and regular rhythm.  Pulmonary:     Effort: Pulmonary effort is normal. No respiratory distress.     Breath sounds: Normal breath sounds.  Abdominal:     General: There is no distension.     Palpations: Abdomen is soft.     Tenderness: There is no abdominal tenderness.  Skin:    General: Skin is warm and dry.   Neurological:     General: No focal deficit present.     Mental Status: She is alert.     ED Results / Procedures / Treatments   Labs (all labs ordered are listed, but only abnormal results are displayed) Labs Reviewed  URINALYSIS, W/ REFLEX TO CULTURE (INFECTION SUSPECTED) - Abnormal; Notable for the following components:      Result Value   Color, Urine AMBER (*)    APPearance TURBID (*)    Glucose, UA >=500 (*)    Leukocytes,Ua MODERATE (*)    Bacteria, UA RARE (*)    All other components within normal limits  URINE CULTURE  COMPREHENSIVE METABOLIC PANEL  LIPASE, BLOOD  CBC WITH DIFFERENTIAL/PLATELET  HCG, QUANTITATIVE, PREGNANCY  TROPONIN I (HIGH SENSITIVITY)    EKG None  Radiology CT ABDOMEN PELVIS W CONTRAST Result Date: 05/09/2023 CLINICAL DATA:  Acute abdominal pain. Left upper quadrant abdominal pain. EXAM: CT ABDOMEN AND PELVIS WITH CONTRAST TECHNIQUE: Multidetector CT imaging of the abdomen and pelvis was performed using the standard protocol following bolus administration of intravenous contrast. RADIATION DOSE REDUCTION: This exam was performed according to the departmental dose-optimization program which includes automated exposure control, adjustment of the mA and/or kV according to patient size and/or use of iterative reconstruction technique. CONTRAST:  75mL OMNIPAQUE IOHEXOL 350 MG/ML SOLN COMPARISON:  None Available.  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 No basilar airspace disease or pleural effusion. Hepatobiliary: No focal liver abnormality is seen. No gallstones, gallbladder wall thickening, or biliary dilatation. Pancreas: Unremarkable. No pancreatic ductal dilatation or surrounding inflammatory changes. Spleen: Normal in size without focal abnormality. Adrenals/Urinary Tract: Normal adrenal glands. No hydronephrosis, renal calculi or renal inflammation. Unremarkable urinary bladder. Stomach/Bowel: The appendix is upper normal  in size at 7 mm with mild wall thickening. There is no periappendiceal fat stranding or appendicolith. The stomach is nondistended. No small bowel obstruction. Occasional fecalization of distal small bowel contents. Moderate colonic stool burden. Vascular/Lymphatic: No acute vascular findings. Normal caliber abdominal aorta. No bulky abdominopelvic adenopathy. Reproductive: Uterus and bilateral adnexa are unremarkable. Other: No free air, free fluid, or intra-abdominal fluid collection. Musculoskeletal: There are no acute or suspicious osseous abnormalities. IMPRESSION: 1. The appendix is upper normal in size at 7 mm with mild wall thickening. There is no periappendiceal fat stranding or appendicolith. Findings are equivocal for early acute appendicitis. 2. Moderate colonic stool burden with occasional fecalization of distal small bowel contents, suggesting slow transit. Electronically Signed   By: Narda Rutherford M.D.   On: 05/09/2023 23:33    Procedures Procedures    Medications Ordered in ED Medications  acetaminophen (TYLENOL) tablet 1,000 mg (1,000 mg Oral Given 05/09/23 2024)  iohexol (OMNIPAQUE) 350 MG/ML injection 75 mL (75 mLs Intravenous Contrast Given 05/09/23 2142)    ED Course/ Medical Decision Making/ A&P                                 Medical Decision Making  This patient is a 28 y.o. female  who presents to the ED for concern of abdominal pain.   Differential diagnoses prior to evaluation: The emergent differential diagnosis includes, but is not limited to,  Mesenteric ischemia, diverticulitis, nephrolithiasis, constipation, bowel obstruction, IBD,  ectopic pregnancy, ovarian torsion. This is not an exhaustive differential.   Past Medical History / Co-morbidities / Social History: PROM with pregnancy in 2020  Physical Exam: Physical exam performed. The pertinent findings include: Normal vital signs, no acute distress.  Abdomen soft and nontender.  Lab Tests/Imaging  studies: I personally interpreted labs/imaging and the pertinent results include: CBC and CMP normal.  UA with moderate leukocytes, 11-20 WBCs, and rare bacteria.  In the setting of no urinary symptoms, urine culture was sent from triage.  Negative pregnancy.  Normal lipase.  Negative troponin.  CT abdomen pelvis with appendix mildly enlarged with mild wall thickening, no fat stranding or appendicolith, equivocal for early appendicitis. I agree with the radiologist interpretation.  Medications: Patient received Tylenol in triage.  I have reviewed the patients home medicines and have made adjustments as needed.  Consultations obtained: I consulted with general surgeon Dr Derrell Lolling who recommended: if patient completely pain free, can dc to home with strict return precautions   Disposition: After consideration of the diagnostic results and the patients response to treatment, I feel that emergency department workup does not suggest an emergent condition requiring admission or immediate intervention beyond what has been performed at this time. The plan is: discharge to home. Pt having no pain at present, overall abdominal exam is nonsurgical. Labs very reassuring. Explained urinalysis results and patient is adamant she has no urinary symptoms. She understands a urine culture is pending. Explained on CT pt has moderate stool burden, recommended miralax. Given very strict return  precautions. The patient is safe for discharge and has been instructed to return immediately for worsening symptoms, change in symptoms or any other concerns. Pt is agreeable to the plan.   I discussed this case with my attending physician Dr. Blinda Leatherwood who cosigned this note including patient's presenting symptoms, physical exam, and planned diagnostics and interventions. Attending physician stated agreement with plan or made changes to plan which were implemented.    Final Clinical Impression(s) / ED Diagnoses Final diagnoses:   Left sided abdominal pain    Rx / DC Orders ED Discharge Orders     None      Portions of this report may have been transcribed using voice recognition software. Every effort was made to ensure accuracy; however, inadvertent computerized transcription errors may be present.    Su Monks, PA-C 05/10/23 0127    Gilda Crease, MD 05/10/23 [phone]

## 2023-05-10 NOTE — ED Notes (Signed)
Patient verbalizes understanding of discharge instructions. Opportunity for questioning and answers were provided. Armband removed by staff, pt discharged from ED. Ambulated out to lobby  

## 2023-05-10 NOTE — Discharge Instructions (Addendum)
 You were seen in the ER for left sided abdominal pain.  Your blood work was all normal. Your urine sample showed a possible urinary tract infection. We have sent this for a urine culture. If it is positive for an infection, you will receive a call from the hospital to start treatment.  Your CT scan showed some inflammation around your appendix. I consulted with a surgeon and they reviewed your images and don't feel there is anything to do from a surgical standpoint.  Your CT scan also showed a back up of stool. I recommend taking dulcolax or using a capful of miralax daily for a few days.  Continue to monitor how you're doing and return to the ER for new or worsening symptoms such as worsening pain, right sided abdominal pain, nausea/vomiting, or if you develop a fever.

## 2023-05-11 LAB — URINE CULTURE: Culture: 10000 — AB

## 2023-07-11 ENCOUNTER — Ambulatory Visit: Payer: BLUE CROSS/BLUE SHIELD

## 2023-08-30 ENCOUNTER — Telehealth: Admitting: Physician Assistant

## 2023-08-30 DIAGNOSIS — B3731 Acute candidiasis of vulva and vagina: Secondary | ICD-10-CM

## 2023-08-30 MED ORDER — FLUCONAZOLE 150 MG PO TABS: 150.0000 mg | ORAL_TABLET | ORAL | 0 refills | Status: AC | PRN

## 2023-08-30 NOTE — Progress Notes (Signed)

## 2023-09-24 ENCOUNTER — Other Ambulatory Visit: Payer: Self-pay

## 2023-09-24 ENCOUNTER — Other Ambulatory Visit (HOSPITAL_COMMUNITY)
Admission: RE | Admit: 2023-09-24 | Discharge: 2023-09-24 | Disposition: A | Discharge status: Home / Self Care | Source: Ambulatory Visit | Attending: Family Medicine | Admitting: Family Medicine

## 2023-09-24 ENCOUNTER — Ambulatory Visit

## 2023-09-24 DIAGNOSIS — N898 Other specified noninflammatory disorders of vagina: Secondary | ICD-10-CM | POA: Diagnosis present

## 2023-09-24 NOTE — Progress Notes (Signed)
 Pt in office today for nurse visit.  She reports an increased amount of  white and milky vaginal discharge, along with vaginal itching.  She denies any odor or pain.  Pt agreeable to completing a vaginal self swab today.  She reports having a new sexual partner recently and requests STD testing be added to swab as well.  Advised pt that office staff with contact her for any abnormal results or needed treatment.  PT verbalized understanding with no further questions.    [place], RN

## 2023-09-25 LAB — CERVICOVAGINAL ANCILLARY ONLY
Bacterial Vaginitis (gardnerella): NEGATIVE
Chlamydia: NEGATIVE
Comment: NEGATIVE
Comment: NEGATIVE
Comment: NEGATIVE
Comment: NORMAL
Neisseria Gonorrhea: NEGATIVE
Trichomonas: NEGATIVE

## 2023-10-08 ENCOUNTER — Ambulatory Visit: Admitting: Obstetrics and Gynecology

## 2023-11-10 ENCOUNTER — Ambulatory Visit
Admission: EM | Admit: 2023-11-10 | Discharge: 2023-11-10 | Disposition: A | Discharge status: Home / Self Care | Attending: Internal Medicine | Admitting: Internal Medicine

## 2023-11-10 ENCOUNTER — Encounter: Payer: Self-pay | Admitting: Emergency Medicine

## 2023-11-10 DIAGNOSIS — L239 Allergic contact dermatitis, unspecified cause: Secondary | ICD-10-CM | POA: Diagnosis not present

## 2023-11-10 DIAGNOSIS — B9689 Other specified bacterial agents as the cause of diseases classified elsewhere: Secondary | ICD-10-CM

## 2023-11-10 DIAGNOSIS — R21 Rash and other nonspecific skin eruption: Secondary | ICD-10-CM | POA: Diagnosis not present

## 2023-11-10 DIAGNOSIS — J019 Acute sinusitis, unspecified: Secondary | ICD-10-CM

## 2023-11-10 MED ORDER — CETIRIZINE HCL 10 MG PO TABS: 10.0000 mg | ORAL_TABLET | Freq: Every day | ORAL | 0 refills | Status: AC

## 2023-11-10 MED ORDER — PREDNISONE 20 MG PO TABS: 40.0000 mg | ORAL_TABLET | Freq: Every day | ORAL | 0 refills | Status: AC

## 2023-11-10 MED ORDER — AMOXICILLIN-POT CLAVULANATE 875-125 MG PO TABS: 1.0000 | ORAL_TABLET | Freq: Two times a day (BID) | ORAL | 0 refills | Status: AC

## 2023-11-10 NOTE — ED Triage Notes (Addendum)
 Pt c/o rash on bilateral side of back since about Monday. States it is very itchy especially in the morning, but not painful. Denies changing soaps or lotion.  Pt has also had sinus congestion since begging of week

## 2023-11-10 NOTE — ED Provider Notes (Signed)
 [facility]    CSN: [id number] Arrival date & time: 11/10/23  1335      History   Chief Complaint Chief Complaint  Patient presents with   Rash    HPI Kari Schmidt is a 28 y.o. female.   Kari Schmidt is a 28 y.o. female presenting for chief complaint of rash to the bilateral sides of the torso and low back that started Monday, November 05, 2023 (5 days ago).  Denies recent known exposures to potential allergens including new medications, laundry detergents, personal hygiene products, poisonous plants, new bedding, and new clothing.  Similar rash happened last year and she was able to take Benadryl /applied Benadryl  cream and rash fully went away.  She took Benadryl  which helped a little bit with the itching this time but rash persists.  Denies fever, chills, body aches, and drainage/pus from the rash.  No recent sick contacts with similar rash.  Denies recent antibiotic or steroid use.  She would also like to be evaluated for sinus congestion started 7 days ago.  Nasal mucus is clear/yellow.  Reports pain to the left maxillary sinus and burning sensation to the left maxillary sinus/nostril.  She had cough and sore throat at the beginning of illness, sinus pressure remains.  Denies shortness of breath, chest pain, heart palpitations, nausea, vomiting, neck pain, dizziness, and ear pain.  Taking Mucinex with some temporary relief.     Past Medical History:  Diagnosis Date   External hemorrhoids    PROM (premature rupture of membranes) 10/06/2018   SVD (spontaneous vaginal delivery) 10/07/2018    Patient Active Problem List   Diagnosis Date Noted   Vitiligo 10/11/2021   Hemorrhoids 07/11/2021   Lesion of labia 07/11/2021   IUGR (intrauterine growth restriction) affecting care of mother 07/12/2020   Alpha thalassemia silent carrier 06/10/2020    Past Surgical History:  Procedure Laterality Date   NO PAST SURGERIES      OB History     Gravida  2   Para  2   Term   2   Preterm      AB      Living  2      SAB      IAB      Ectopic      Multiple  0   Live Births  2            Home Medications    Prior to Admission medications   Medication Sig Start Date End Date Taking? Authorizing Provider  amoxicillin -clavulanate (AUGMENTIN ) 875-125 MG tablet Take 1 tablet by mouth every 12 (twelve) hours. 11/10/23  Yes Enedelia Dorna HERO, FNP  cetirizine  (ZYRTEC ) 10 MG tablet Take 1 tablet (10 mg total) by mouth at bedtime. 11/10/23  Yes Enedelia Dorna HERO, FNP  predniSONE  (DELTASONE ) 20 MG tablet Take 2 tablets (40 mg total) by mouth daily with breakfast for 5 days. 11/10/23 11/15/23 Yes Jibran Crookshanks, Dorna HERO, FNP  fluconazole  (DIFLUCAN ) 150 MG tablet Take 1 tablet (150 mg total) by mouth every 3 (three) days as needed. 08/30/23   Vivienne Delon HERO, PA-C    Family History Family History  Problem Relation Age of Onset   Asthma Mother    Colon cancer Neg Hx    Esophageal cancer Neg Hx     Social History Social History   Tobacco Use   Smoking status: Never   Smokeless tobacco: Never  Vaping Use   Vaping status: Never Used  Substance Use  Topics   Alcohol use: Not Currently    Comment: 3 years ago   Drug use: Never     Allergies   Advil  [ibuprofen ]   Review of Systems Review of Systems Per HPI  Physical Exam Triage Vital Signs ED Triage Vitals  Encounter Vitals Group     BP 11/10/23 1341 120/81     Girls Systolic BP Percentile --      Girls Diastolic BP Percentile --      Boys Systolic BP Percentile --      Boys Diastolic BP Percentile --      Pulse Rate 11/10/23 1341 86     Resp 11/10/23 1341 17     Temp 11/10/23 1341 98.1 F (36.7 C)     Temp Source 11/10/23 1341 Oral     SpO2 11/10/23 1341 97 %     Weight --      Height --      Head Circumference --      Peak Flow --      Pain Score 11/10/23 1346 0     Pain Loc --      Pain Education --      Exclude from Growth Chart --    No data found.  Updated  Vital Signs BP 120/81 (BP Location: Right Arm)   Pulse 86   Temp 98.1 F (36.7 C) (Oral)   Resp 17   LMP 10/16/2023   SpO2 97%   Visual Acuity Right Eye Distance:   Left Eye Distance:   Bilateral Distance:    Right Eye Near:   Left Eye Near:    Bilateral Near:     Physical Exam Vitals and nursing note reviewed.  Constitutional:      Appearance: She is not ill-appearing or toxic-appearing.  HENT:     Head: Normocephalic and atraumatic.     Right Ear: Hearing, tympanic membrane, ear canal and external ear normal.     Left Ear: Hearing, tympanic membrane, ear canal and external ear normal.     Nose: Congestion present.     Left Sinus: Maxillary sinus tenderness present.     Mouth/Throat:     Lips: Pink.     Mouth: Mucous membranes are moist. No injury or oral lesions.     Dentition: Normal dentition.     Tongue: No lesions.     Pharynx: Oropharynx is clear. Uvula midline. No pharyngeal swelling, oropharyngeal exudate, posterior oropharyngeal erythema, uvula swelling or postnasal drip.     Tonsils: No tonsillar exudate.  Eyes:     General: Lids are normal. Vision grossly intact. Gaze aligned appropriately.     Extraocular Movements: Extraocular movements intact.     Conjunctiva/sclera: Conjunctivae normal.  Neck:     Trachea: Trachea and phonation normal.  Cardiovascular:     Rate and Rhythm: Normal rate and regular rhythm.     Heart sounds: Normal heart sounds, S1 normal and S2 normal.  Pulmonary:     Effort: Pulmonary effort is normal. No respiratory distress.     Breath sounds: Normal breath sounds and air entry.  Musculoskeletal:     Cervical back: Neck supple.  Lymphadenopathy:     Cervical: No cervical adenopathy.  Skin:    General: Skin is warm and dry.     Capillary Refill: Capillary refill takes less than 2 seconds.     Findings: Rash (Erythematous maculopapular rash to the bilateral sides/torso and maculopapular rash to the mid low back without as much  pronounced erythema.  See images below.) present.  Neurological:     General: No focal deficit present.     Mental Status: She is alert and oriented to person, place, and time. Mental status is at baseline.     Cranial Nerves: No dysarthria or facial asymmetry.  Psychiatric:        Mood and Affect: Mood normal.        Speech: Speech normal.        Behavior: Behavior normal.        Thought Content: Thought content normal.        Judgment: Judgment normal.    Back   Right hip    Left hip    UC Treatments / Results  Labs (all labs ordered are listed, but only abnormal results are displayed) Labs Reviewed - No data to display  EKG   Radiology No results found.  Procedures Procedures (including critical care time)  Medications Ordered in UC Medications - No data to display  Initial Impression / Assessment and Plan / UC Course  I have reviewed the triage vital signs and the nursing notes.  Pertinent labs & imaging results that were available during my care of the patient were reviewed by me and considered in my medical decision making (see chart for details).   1.  Rash and nonspecific skin eruption, allergic contact dermatitis unspecified trigger Presentation consistent with acute hypersensitivity reaction, likely acute allergic reaction.  No signs of anaphylaxis, HEENT exam stable, lungs clear.  Will treat with steroids, antihistamines, and supportive care. Advised to avoid known and potential allergens. Follow-up with PCP and/or allergy specialist for persistent symptoms.    2.  Acute bacterial sinusitis Presentation is consistent with acute postviral bacterial sinusitis.   Symptoms have been present for greater than 7 days and have not responded well to over-the-counter therapies, therefore may have antibiotic.  Prescriptions for further symptomatic relief sent, may continue using OTC medications as needed. Deferred imaging of the chest based on stable  cardiopulmonary exam and hemodynamically stable vital signs. Recommend warm compresses to the sinuses as needed.  Augmentin  will also treat any bacterial component to rash.  Counseled patient on potential for adverse effects with medications prescribed/recommended today, strict ER and return-to-clinic precautions discussed, patient verbalized understanding.    Final Clinical Impressions(s) / UC Diagnoses   Final diagnoses:  Rash and nonspecific skin eruption  Allergic contact dermatitis, unspecified trigger  Acute bacterial sinusitis     Discharge Instructions      Take Augmentin  antibiotic twice daily (1 pill in the morning and 1 pill at night) for the next 7 days to treat sinus infection.  This will help with your nasal congestion.  Take prednisone  2 pills once daily for 5 days. Take prednisone  with food so that your stomach does not get upset. Take prednisone  preferably in the mornings.  Take Zyrtec  10 mg once daily in the evenings for itching.  If you notice pus or drainage from the rash, please return to urgent care for reevaluation.  If you develop any new or worsening symptoms or if your symptoms do not start to improve, please return here or follow-up with your primary care provider. If your symptoms are severe, please go to the emergency room.   ED Prescriptions     Medication Sig Dispense Auth. Provider   amoxicillin -clavulanate (AUGMENTIN ) 875-125 MG tablet Take 1 tablet by mouth every 12 (twelve) hours. 14 tablet Enedelia Dorna HERO, FNP   predniSONE  (DELTASONE ) 20 MG  tablet Take 2 tablets (40 mg total) by mouth daily with breakfast for 5 days. 10 tablet Enedelia Dorna HERO, FNP   cetirizine  (ZYRTEC ) 10 MG tablet Take 1 tablet (10 mg total) by mouth at bedtime. 30 tablet Enedelia Dorna HERO, FNP      PDMP not reviewed this encounter.   Enedelia Dorna HERO, [place] 11/10/23 1407

## 2023-11-10 NOTE — Discharge Instructions (Signed)
 Take Augmentin  antibiotic twice daily (1 pill in the morning and 1 pill at night) for the next 7 days to treat sinus infection.  This will help with your nasal congestion.  Take prednisone  2 pills once daily for 5 days. Take prednisone  with food so that your stomach does not get upset. Take prednisone  preferably in the mornings.  Take Zyrtec  10 mg once daily in the evenings for itching.  If you notice pus or drainage from the rash, please return to urgent care for reevaluation.  If you develop any new or worsening symptoms or if your symptoms do not start to improve, please return here or follow-up with your primary care provider. If your symptoms are severe, please go to the emergency room.

## 2023-12-05 ENCOUNTER — Other Ambulatory Visit: Payer: Self-pay

## 2023-12-05 ENCOUNTER — Ambulatory Visit
Admission: EM | Admit: 2023-12-05 | Discharge: 2023-12-05 | Disposition: A | Discharge status: Home / Self Care | Attending: Physician Assistant | Admitting: Physician Assistant

## 2023-12-05 DIAGNOSIS — Z3202 Encounter for pregnancy test, result negative: Secondary | ICD-10-CM | POA: Diagnosis present

## 2023-12-05 DIAGNOSIS — N898 Other specified noninflammatory disorders of vagina: Secondary | ICD-10-CM | POA: Insufficient documentation

## 2023-12-05 LAB — POCT URINE PREGNANCY: Preg Test, Ur: NEGATIVE

## 2023-12-05 NOTE — ED Provider Notes (Signed)
 [facility]    CSN: [id number] Arrival date & time: 12/05/23  1214      History   Chief Complaint Chief Complaint  Patient presents with   Needs Pregnancy Test     HPI Forrestine Lecrone is a 28 y.o. female.  has a past medical history of External hemorrhoids, PROM (premature rupture of membranes) (10/06/2018), and SVD (spontaneous vaginal delivery) (10/07/2018).   HPI  Discussed the use of AI scribe software for clinical note transcription with the patient, who gave verbal consent to proceed.  The patient presents for a pregnancy test required for her acne medication.  She has irregular periods after recently discontinuing birth control, noting that her menstrual cycle is 'trying to get back to normal.'  She experiences vaginal discharge without associated pain. She was tested for infections one to two months ago, and the results were negative. However, she still experiences occasional itching. No abdominal pain or abnormal vaginal bleeding.  Past Medical History:  Diagnosis Date   External hemorrhoids    PROM (premature rupture of membranes) 10/06/2018   SVD (spontaneous vaginal delivery) 10/07/2018    Patient Active Problem List   Diagnosis Date Noted   Vitiligo 10/11/2021   Hemorrhoids 07/11/2021   Lesion of labia 07/11/2021   IUGR (intrauterine growth restriction) affecting care of mother 07/12/2020   Alpha thalassemia silent carrier 06/10/2020    Past Surgical History:  Procedure Laterality Date   NO PAST SURGERIES      OB History     Gravida  2   Para  2   Term  2   Preterm      AB      Living  2      SAB      IAB      Ectopic      Multiple  0   Live Births  2            Home Medications    Prior to Admission medications   Medication Sig Start Date End Date Taking? Authorizing Provider  amoxicillin -clavulanate (AUGMENTIN ) 875-125 MG tablet Take 1 tablet by mouth every 12 (twelve) hours. 11/10/23   Enedelia Dorna HERO, FNP   cetirizine  (ZYRTEC ) 10 MG tablet Take 1 tablet (10 mg total) by mouth at bedtime. 11/10/23   Enedelia Dorna HERO, FNP  fluconazole  (DIFLUCAN ) 150 MG tablet Take 1 tablet (150 mg total) by mouth every 3 (three) days as needed. 08/30/23   Vivienne Delon HERO, PA-C    Family History Family History  Problem Relation Age of Onset   Asthma Mother    Colon cancer Neg Hx    Esophageal cancer Neg Hx     Social History Social History   Tobacco Use   Smoking status: Never   Smokeless tobacco: Never  Vaping Use   Vaping status: Never Used  Substance Use Topics   Alcohol use: Not Currently    Comment: 3 years ago   Drug use: Never     Allergies   Advil  [ibuprofen ]   Review of Systems Review of Systems  Gastrointestinal:  Negative for abdominal pain.  Genitourinary:  Negative for menstrual problem, vaginal bleeding, vaginal discharge and vaginal pain.     Physical Exam Triage Vital Signs ED Triage Vitals  Encounter Vitals Group     BP 12/05/23 1237 107/74     Girls Systolic BP Percentile --      Girls Diastolic BP Percentile --      Boys Systolic BP  Percentile --      Boys Diastolic BP Percentile --      Pulse Rate 12/05/23 1237 73     Resp 12/05/23 1237 16     Temp 12/05/23 1237 97.9 F (36.6 C)     Temp Source 12/05/23 1237 Oral     SpO2 12/05/23 1237 98 %     Weight 12/05/23 1236 110 lb (49.9 kg)     Height 12/05/23 1236 5' 1.5 (1.562 m)     Head Circumference --      Peak Flow --      Pain Score 12/05/23 1235 0     Pain Loc --      Pain Education --      Exclude from Growth Chart --    No data found.  Updated Vital Signs BP 107/74 (BP Location: Right Arm)   Pulse 73   Temp 97.9 F (36.6 C) (Oral)   Resp 16   Ht 5' 1.5 (1.562 m)   Wt 110 lb (49.9 kg)   LMP 11/12/2023 (Exact Date)   SpO2 98%   BMI 20.45 kg/m   Visual Acuity Right Eye Distance:   Left Eye Distance:   Bilateral Distance:    Right Eye Near:   Left Eye Near:    Bilateral Near:      Physical Exam Vitals reviewed.  Constitutional:      General: She is awake.     Appearance: Normal appearance. She is well-developed and well-groomed.  HENT:     Head: Normocephalic and atraumatic.  Eyes:     General: Lids are normal. Gaze aligned appropriately.     Extraocular Movements: Extraocular movements intact.     Conjunctiva/sclera: Conjunctivae normal.  Pulmonary:     Effort: Pulmonary effort is normal.  Neurological:     Mental Status: She is alert and oriented to person, place, and time.  Psychiatric:        Attention and Perception: Attention and perception normal.        Mood and Affect: Mood and affect normal.        Speech: Speech normal.        Behavior: Behavior normal. Behavior is cooperative.      UC Treatments / Results  Labs (all labs ordered are listed, but only abnormal results are displayed) Labs Reviewed  POCT URINE PREGNANCY - Normal  CERVICOVAGINAL ANCILLARY ONLY    EKG   Radiology No results found.  Procedures Procedures (including critical care time)  Medications Ordered in UC Medications - No data to display  Initial Impression / Assessment and Plan / UC Course  I have reviewed the triage vital signs and the nursing notes.  Pertinent labs & imaging results that were available during my care of the patient were reviewed by me and considered in my medical decision making (see chart for details).      Final Clinical Impressions(s) / UC Diagnoses   Final diagnoses:  Pregnancy examination or test, negative result  Vaginal itching  Patient presents today for urine pregnancy test.  She reports that she needs a documented negative to start medication for skin condition.  Urine pregnancy test negative.  Results discussed with patient during appointment.  Patient states that she is also having some vulvovaginal discharge but most recent testing completed in July was negative.  Repeat cervicovaginal swab today to assess for BV, yeast,  trichomoniasis, gonorrhea, chlamydia.  Results to dictate further management.  Follow-up as needed   Discharge Instructions  VISIT SUMMARY:  You came in today for a pregnancy test required for your acne medication. We also discussed your irregular periods and vaginal discharge.  YOUR PLAN: Your pregnancy testing was negative.  -VAGINAL DISCHARGE: Vaginal discharge can be caused by various factors, including infections or hormonal changes. Since you have occasional itching and previous tests were negative for infections, we will perform a vaginal swab to test for infections again. We will review the results in 1-3 days and update you accordingly.  INSTRUCTIONS:  We will contact you with the results of your vaginal swab test in 1-3 days.     ED Prescriptions   None    PDMP not reviewed this encounter.   Marylene Rocky BRAVO, PA-C 12/05/23 1327

## 2023-12-05 NOTE — ED Triage Notes (Addendum)
 Pt presents to urgent care for pregnancy testing. States she is thinking of starting acne medication with online dermatologist. Needs proof of negative pregnancy test before she can be prescribed the medication. No symptoms. No pain.

## 2023-12-05 NOTE — Discharge Instructions (Signed)
 VISIT SUMMARY:  You came in today for a pregnancy test required for your acne medication. We also discussed your irregular periods and vaginal discharge.  YOUR PLAN: Your pregnancy testing was negative.  -VAGINAL DISCHARGE: Vaginal discharge can be caused by various factors, including infections or hormonal changes. Since you have occasional itching and previous tests were negative for infections, we will perform a vaginal swab to test for infections again. We will review the results in 1-3 days and update you accordingly.  INSTRUCTIONS:  We will contact you with the results of your vaginal swab test in 1-3 days.

## 2023-12-06 LAB — CERVICOVAGINAL ANCILLARY ONLY
Bacterial Vaginitis (gardnerella): NEGATIVE
Candida Glabrata: NEGATIVE
Candida Vaginitis: NEGATIVE
Chlamydia: NEGATIVE
Comment: NEGATIVE
Comment: NEGATIVE
Comment: NEGATIVE
Comment: NEGATIVE
Comment: NEGATIVE
Comment: NORMAL
Neisseria Gonorrhea: NEGATIVE
Trichomonas: NEGATIVE

## 2024-03-25 ENCOUNTER — Other Ambulatory Visit: Payer: Self-pay

## 2024-03-25 ENCOUNTER — Ambulatory Visit: Payer: Self-pay | Admitting: Obstetrics and Gynecology

## 2024-03-25 ENCOUNTER — Other Ambulatory Visit (HOSPITAL_COMMUNITY)
Admission: RE | Admit: 2024-03-25 | Discharge: 2024-03-25 | Disposition: A | Discharge status: Home / Self Care | Source: Ambulatory Visit | Attending: Obstetrics and Gynecology | Admitting: Obstetrics and Gynecology

## 2024-03-25 ENCOUNTER — Encounter: Payer: Self-pay | Admitting: Obstetrics and Gynecology

## 2024-03-25 VITALS — BP 128/98 | HR 91 | Wt 109.4 lb

## 2024-03-25 DIAGNOSIS — Z113 Encounter for screening for infections with a predominantly sexual mode of transmission: Secondary | ICD-10-CM | POA: Diagnosis present

## 2024-03-25 DIAGNOSIS — Z01419 Encounter for gynecological examination (general) (routine) without abnormal findings: Secondary | ICD-10-CM

## 2024-03-25 DIAGNOSIS — Z3042 Encounter for surveillance of injectable contraceptive: Secondary | ICD-10-CM | POA: Diagnosis not present

## 2024-03-25 DIAGNOSIS — N898 Other specified noninflammatory disorders of vagina: Secondary | ICD-10-CM

## 2024-03-25 DIAGNOSIS — N941 Unspecified dyspareunia: Secondary | ICD-10-CM

## 2024-03-25 DIAGNOSIS — N760 Acute vaginitis: Secondary | ICD-10-CM

## 2024-03-25 DIAGNOSIS — Z3009 Encounter for other general counseling and advice on contraception: Secondary | ICD-10-CM

## 2024-03-25 LAB — POCT PREGNANCY, URINE: Preg Test, Ur: NEGATIVE

## 2024-03-25 MED ORDER — MEDROXYPROGESTERONE ACETATE 150 MG/ML IM SUSY
150.0000 mg | PREFILLED_SYRINGE | Freq: Once | INTRAMUSCULAR | Status: AC
  Administered 2024-03-25: 150 mg via INTRAMUSCULAR

## 2024-03-25 MED ORDER — ESTRADIOL 0.01 % VA CREA: TOPICAL_CREAM | VAGINAL | 2 refills | Status: AC

## 2024-03-25 NOTE — Progress Notes (Signed)
 "  ANNUAL EXAM Patient name: Kari Schmidt MRN [id number]  Date of birth: 06/29/1995 Chief Complaint:   Gynecologic Exam  History of Present Illness:   Kari Schmidt is a 29 y.o. G2P2002 being seen today for a routine annual exam.  Current complaints: resume depo - on accutane and told to be on contraception  Doesn't trust herself with the pill. Had prolonged bleeding on the first.   Menstrual concerns? No   Breast or nipple changes? No  Contraception use?  N/A Sexually active? No - not within the last 2 weeks  Notes having itching without odor, with a lot of discharge. Every swab is negative.  Daily urge to itch but avoids scratching. Since last summer. Will have the itching at the opening of the vagina and has not tried anything. No change in soap, under wear, detergent, uses pad and tampons - Always brand liners and not sure the tampon brand. Nothing that she is aware of that makes it better or worse. Will scratch in the shower to get rid of the sensation. Occasional light green discharge, none today, today a normal milky white discharge   Itching on the outside around the 'opening' and to check for pelvic weakness. Denies urinary incontinence. When having sex, feels less sensitive   No LMP recorded.   The pregnancy intention screening data noted above was reviewed. Potential methods of contraception were discussed. The patient elected to proceed with No data recorded.   Last pap 03/16/2020 NILM  Last mammogram: n/a.  Last colonoscopy: n/a.      11/16/2022    3:46 PM 10/11/2021    9:29 AM 09/07/2021    3:12 PM 08/24/2021    2:06 PM 07/11/2021    3:09 PM  Depression screen PHQ 2/9  Decreased Interest 0 0 0 0 0  Down, Depressed, Hopeless 0 0 0 0 0  PHQ - 2 Score 0 0 0 0 0  Altered sleeping 0 0 0 0 0  Tired, decreased energy 0 0 0 2 3  Change in appetite 0 0 0 0 0  Feeling bad or failure about yourself  0 0 0 0 0  Trouble concentrating 0 0 0 0 0  Moving slowly or fidgety/restless 0 0 0  0 0  Suicidal thoughts 0 0 0 0 0  PHQ-9 Score 0  0  0  2  3   Difficult doing work/chores     Somewhat difficult     Data saved with a previous flowsheet row definition        11/16/2022    3:46 PM 10/11/2021    9:29 AM 09/07/2021    3:12 PM 08/24/2021    2:07 PM  GAD 7 : Generalized Anxiety Score  Nervous, Anxious, on Edge 0  0  0  0   Control/stop worrying 0  0  0  0   Worry too much - different things 0  0  0  0   Trouble relaxing 0  0  0  0   Restless 0  0  0  0   Easily annoyed or irritable 0  0  0  0   Afraid - awful might happen 0  0  0  0   Total GAD 7 Score 0 0 0 0     Data saved with a previous flowsheet row definition     Review of Systems:   Pertinent items are noted in HPI Denies any headaches, blurred vision, fatigue, shortness of  breath, chest pain, abdominal pain, abnormal vaginal discharge/itching/odor/irritation, problems with periods, bowel movements, urination, or intercourse unless otherwise stated above. Pertinent History Reviewed:  Reviewed past medical,surgical, social and family history.  Reviewed problem list, medications and allergies. Physical Assessment:   Vitals:   03/25/24 1457  BP: (!) 128/98  Pulse: 91  Weight: 109 lb 6.4 oz (49.6 kg)  There is no height or weight on file to calculate BMI.        Physical Examination:   General appearance - well appearing, and in no distress  Mental status - alert, oriented to person, place, and time  Psych:  She has a normal mood and affect  Skin - warm and dry, normal color, no suspicious lesions noted  Chest - effort normal, all lung fields clear to auscultation bilaterally  Heart - normal rate and regular rhythm  Abdomen - soft, nontender, nondistended, no masses or organomegaly  Pelvic -  VULVA: normal appearing vulva with no masses, tenderness or lesions   VAGINA: normal appearing vagina with normal color and discharge, no lesions   CERVIX: normal appearing cervix with small volume discharge, no  lesions, no CMT  Thin prep pap is done with HR HPV cotesting  Kegel 2/5, slight tenderness of left ischococcygeous      UTERUS: uterus is felt to be normal size, shape, consistency and nontender   ADNEXA: No adnexal masses or tenderness noted.  Extremities:  No swelling or varicosities noted  Chaperone present for exam  No results found for this or any previous visit (from the past 24 hours).    Assessment & Plan:  1. Well woman exam with routine gynecological exam (Primary) - Cervical cancer screening: Discussed screening Q3 years. Reviewed importance of annual exams and limits of pap smear. Pap with reflex HPV collected - GC/CT: Discussed and recommended. Pt  accepts - Birth Control: Depo - Breast Health: Encouraged self breast awareness/exams.  - Follow-up: 12 months and prn  - Cytology - PAP  2. Screening for STD (sexually transmitted disease) Screening collected - Cytology - PAP - RPR+HBsAg+HCVAb+...  3. Encounter for counseling regarding contraception Will resume depo provera . Noted that may have abnormal bleeding in the beginning as she had previously. Importance of maintaining contraception while on accutane reiterated.   4. Dyspareunia in female Noted to have decreased pelvic floor strength on exam and reports intercourse not quite the same as it was prior to babies x2. Open to trial of pelvic physical therapy  - Ambulatory referral to Physical Therapy  5. Inflammation of vaginal introitus 6. Vaginal itching Trial of vaginal estrogen at introitus for itching given location though etiology not clear. - estradiol  (ESTRACE ) 0.01 % CREA vaginal cream; Apply 1 gram per vagina every night for 2 weeks, then apply three times a week  Dispense: 42.5 g; Refill: 2    Orders Placed This Encounter  Procedures   RPR+HBsAg+HCVAb+...   Ambulatory referral to Physical Therapy   Pregnancy, urine POC    Meds:  Meds ordered this encounter  Medications   estradiol  (ESTRACE ) 0.01  % CREA vaginal cream    Sig: Apply 1 gram per vagina every night for 2 weeks, then apply three times a week    Dispense:  42.5 g    Refill:  2   medroxyPROGESTERone  Acetate SUSY 150 mg    Follow-up: No follow-ups on file.  Carter Quarry, MD 03/25/2024 11:18 AM "

## 2024-03-25 NOTE — Patient Instructions (Signed)
 Kegel Exercises There are many reasons your provider may suggest Kegel exercises. This video will teach you how to do Kegel [email or web address] To view the content, go to this web address: [email or web address]  This video will expire on: 02/07/2025. If you need access to this video following this date, please reach out to the healthcare provider who assigned it to you. This information is not intended to replace advice given to you by your health care provider. Make sure you discuss any questions you have with your health care provider. [employer] Patient Education  2024 [employer].

## 2024-03-26 ENCOUNTER — Ambulatory Visit: Payer: Self-pay | Admitting: Obstetrics and Gynecology

## 2024-03-26 DIAGNOSIS — B3731 Acute candidiasis of vulva and vagina: Secondary | ICD-10-CM

## 2024-03-26 LAB — RPR+HBSAG+HCVAB+...
HIV Screen 4th Generation wRfx: NONREACTIVE
Hep C Virus Ab: NONREACTIVE
Hepatitis B Surface Ag: NEGATIVE
RPR Ser Ql: NONREACTIVE

## 2024-03-26 LAB — CYTOLOGY - PAP: Diagnosis: NEGATIVE

## 2024-03-26 MED ORDER — CLOTRIMAZOLE 1 % VA CREA: 1.0000 | TOPICAL_CREAM | Freq: Every day | VAGINAL | 0 refills | Status: AC

## 2024-06-10 ENCOUNTER — Ambulatory Visit
